# Patient Record
Sex: Male | Born: 1935 | Race: Black or African American | Hispanic: No | Marital: Married | Smoking: Former smoker
Health system: Southern US, Community
[De-identification: ages and names within clinical notes are randomized; demographics above are authoritative.]

## PROBLEM LIST (undated history)

## (undated) DIAGNOSIS — F329 Major depressive disorder, single episode, unspecified: Secondary | ICD-10-CM

## (undated) DIAGNOSIS — M109 Gout, unspecified: Secondary | ICD-10-CM

## (undated) DIAGNOSIS — I2089 Other forms of angina pectoris: Secondary | ICD-10-CM

## (undated) DIAGNOSIS — I2510 Atherosclerotic heart disease of native coronary artery without angina pectoris: Secondary | ICD-10-CM

## (undated) DIAGNOSIS — G20 Parkinson's disease: Secondary | ICD-10-CM

## (undated) DIAGNOSIS — G20A1 Parkinson's disease without dyskinesia, without mention of fluctuations: Secondary | ICD-10-CM

## (undated) DIAGNOSIS — E0590 Thyrotoxicosis, unspecified without thyrotoxic crisis or storm: Secondary | ICD-10-CM

## (undated) DIAGNOSIS — F32A Depression, unspecified: Secondary | ICD-10-CM

## (undated) DIAGNOSIS — I509 Heart failure, unspecified: Secondary | ICD-10-CM

## (undated) DIAGNOSIS — F0390 Unspecified dementia without behavioral disturbance: Secondary | ICD-10-CM

## (undated) DIAGNOSIS — I208 Other forms of angina pectoris: Secondary | ICD-10-CM

## (undated) HISTORY — PX: CORONARY ARTERY BYPASS GRAFT: SHX141

---

## 2001-02-10 ENCOUNTER — Encounter: Payer: Self-pay | Admitting: Pulmonary Disease

## 2001-02-10 ENCOUNTER — Ambulatory Visit (HOSPITAL_COMMUNITY): Admission: RE | Admit: 2001-02-10 | Discharge: 2001-02-10 | Payer: Self-pay | Admitting: Pulmonary Disease

## 2001-02-26 ENCOUNTER — Inpatient Hospital Stay (HOSPITAL_COMMUNITY): Admission: EM | Admit: 2001-02-26 | Discharge: 2001-03-16 | Payer: Self-pay | Admitting: Emergency Medicine

## 2001-02-27 ENCOUNTER — Encounter: Payer: Self-pay | Admitting: Cardiothoracic Surgery

## 2001-02-27 ENCOUNTER — Encounter: Payer: Self-pay | Admitting: Interventional Cardiology

## 2001-02-28 ENCOUNTER — Encounter: Payer: Self-pay | Admitting: Cardiothoracic Surgery

## 2001-03-01 ENCOUNTER — Encounter: Payer: Self-pay | Admitting: Cardiothoracic Surgery

## 2001-03-02 ENCOUNTER — Encounter: Payer: Self-pay | Admitting: Surgery

## 2001-03-03 ENCOUNTER — Encounter: Payer: Self-pay | Admitting: Surgery

## 2001-03-04 ENCOUNTER — Encounter: Payer: Self-pay | Admitting: Cardiothoracic Surgery

## 2001-03-09 ENCOUNTER — Encounter: Payer: Self-pay | Admitting: Cardiothoracic Surgery

## 2001-08-09 ENCOUNTER — Encounter: Payer: Self-pay | Admitting: *Deleted

## 2001-08-10 ENCOUNTER — Inpatient Hospital Stay (HOSPITAL_COMMUNITY): Admission: EM | Admit: 2001-08-10 | Discharge: 2001-08-16 | Payer: Self-pay | Admitting: *Deleted

## 2001-08-12 ENCOUNTER — Encounter: Payer: Self-pay | Admitting: Interventional Cardiology

## 2001-10-06 ENCOUNTER — Encounter (HOSPITAL_COMMUNITY): Admission: RE | Admit: 2001-10-06 | Discharge: 2002-01-04 | Payer: Self-pay | Admitting: Interventional Cardiology

## 2002-01-03 ENCOUNTER — Inpatient Hospital Stay (HOSPITAL_COMMUNITY): Admission: EM | Admit: 2002-01-03 | Discharge: 2002-01-06 | Payer: Self-pay | Admitting: Emergency Medicine

## 2002-01-03 ENCOUNTER — Encounter: Payer: Self-pay | Admitting: Emergency Medicine

## 2002-01-04 ENCOUNTER — Encounter: Payer: Self-pay | Admitting: Interventional Cardiology

## 2004-12-18 ENCOUNTER — Emergency Department (HOSPITAL_COMMUNITY): Admission: EM | Admit: 2004-12-18 | Discharge: 2004-12-18 | Payer: Self-pay | Admitting: Emergency Medicine

## 2005-11-21 ENCOUNTER — Ambulatory Visit (HOSPITAL_COMMUNITY): Admission: RE | Admit: 2005-11-21 | Discharge: 2005-11-21 | Payer: Self-pay | Admitting: Ophthalmology

## 2005-11-25 ENCOUNTER — Encounter: Admission: RE | Admit: 2005-11-25 | Discharge: 2005-11-25 | Payer: Self-pay | Admitting: Neurology

## 2006-05-13 ENCOUNTER — Encounter: Admission: RE | Admit: 2006-05-13 | Discharge: 2006-05-13 | Payer: Self-pay | Admitting: Neurology

## 2007-03-10 ENCOUNTER — Emergency Department (HOSPITAL_COMMUNITY): Admission: EM | Admit: 2007-03-10 | Discharge: 2007-03-10 | Payer: Self-pay | Admitting: Emergency Medicine

## 2008-06-08 ENCOUNTER — Emergency Department (HOSPITAL_COMMUNITY): Admission: EM | Admit: 2008-06-08 | Discharge: 2008-06-08 | Payer: Self-pay | Admitting: Emergency Medicine

## 2008-12-16 ENCOUNTER — Encounter: Admission: RE | Admit: 2008-12-16 | Discharge: 2008-12-16 | Payer: Self-pay | Admitting: Internal Medicine

## 2009-10-30 ENCOUNTER — Emergency Department (HOSPITAL_COMMUNITY): Admission: EM | Admit: 2009-10-30 | Discharge: 2009-10-30 | Payer: Self-pay | Admitting: Family Medicine

## 2009-12-19 ENCOUNTER — Ambulatory Visit: Payer: Self-pay | Admitting: Diagnostic Radiology

## 2009-12-19 ENCOUNTER — Emergency Department (HOSPITAL_BASED_OUTPATIENT_CLINIC_OR_DEPARTMENT_OTHER): Admission: EM | Admit: 2009-12-19 | Discharge: 2009-12-19 | Payer: Self-pay | Admitting: Emergency Medicine

## 2009-12-20 ENCOUNTER — Emergency Department (HOSPITAL_BASED_OUTPATIENT_CLINIC_OR_DEPARTMENT_OTHER): Admission: EM | Admit: 2009-12-20 | Discharge: 2009-12-20 | Payer: Self-pay | Admitting: Emergency Medicine

## 2010-01-12 ENCOUNTER — Encounter: Admission: RE | Admit: 2010-01-12 | Discharge: 2010-01-12 | Payer: Self-pay | Admitting: Podiatry

## 2010-05-01 ENCOUNTER — Emergency Department (HOSPITAL_COMMUNITY): Admission: EM | Admit: 2010-05-01 | Discharge: 2010-05-01 | Payer: Self-pay | Admitting: Emergency Medicine

## 2010-09-10 LAB — DIFFERENTIAL
Basophils Absolute: 0 10*3/uL (ref 0.0–0.1)
Basophils Absolute: 0 10*3/uL (ref 0.0–0.1)
Basophils Relative: 0 % (ref 0–1)
Basophils Relative: 0 % (ref 0–1)
Eosinophils Absolute: 0.1 K/uL (ref 0.0–0.7)
Eosinophils Relative: 2 % (ref 0–5)
Eosinophils Relative: 2 % (ref 0–5)
Lymphocytes Relative: 18 % (ref 12–46)
Lymphocytes Relative: 21 % (ref 12–46)
Lymphs Abs: 1.3 K/uL (ref 0.7–4.0)
Monocytes Absolute: 0.4 10*3/uL (ref 0.1–1.0)
Monocytes Absolute: 0.5 10*3/uL (ref 0.1–1.0)
Monocytes Relative: 7 % (ref 3–12)
Monocytes Relative: 8 % (ref 3–12)
Neutro Abs: 3.8 10*3/uL (ref 1.7–7.7)
Neutro Abs: 4.3 10*3/uL (ref 1.7–7.7)
Neutrophils Relative %: 69 % (ref 43–77)
Neutrophils Relative %: 74 % (ref 43–77)

## 2010-09-10 LAB — CBC
HCT: 39.5 % (ref 39.0–52.0)
Hemoglobin: 13.5 g/dL (ref 13.0–17.0)
MCH: 29.8 pg (ref 26.0–34.0)
MCHC: 33.2 g/dL (ref 30.0–36.0)
MCHC: 34.2 g/dL (ref 30.0–36.0)
MCV: 87.4 fL (ref 78.0–100.0)
MCV: 88.2 fL (ref 78.0–100.0)
Platelets: 132 10*3/uL — ABNORMAL LOW (ref 150–400)
Platelets: 134 10*3/uL — ABNORMAL LOW (ref 150–400)
RBC: 4.52 MIL/uL (ref 4.22–5.81)
RDW: 13.1 % (ref 11.5–15.5)
WBC: 6.2 K/uL (ref 4.0–10.5)

## 2010-09-10 LAB — BASIC METABOLIC PANEL
BUN: 24 mg/dL — ABNORMAL HIGH (ref 6–23)
CO2: 31 mEq/L (ref 19–32)
Glucose, Bld: 100 mg/dL — ABNORMAL HIGH (ref 70–99)
Potassium: 3.3 mEq/L — ABNORMAL LOW (ref 3.5–5.1)
Sodium: 142 mEq/L (ref 135–145)

## 2010-09-10 LAB — URIC ACID: Uric Acid, Serum: 7.2 mg/dL (ref 4.0–7.8)

## 2010-09-12 LAB — POCT I-STAT, CHEM 8
BUN: 30 mg/dL — ABNORMAL HIGH (ref 6–23)
Calcium, Ion: 1.16 mmol/L (ref 1.12–1.32)
Creatinine, Ser: 1.6 mg/dL — ABNORMAL HIGH (ref 0.4–1.5)
Hemoglobin: 14.6 g/dL (ref 13.0–17.0)
Potassium: 3.8 mEq/L (ref 3.5–5.1)
Sodium: 141 mEq/L (ref 135–145)
TCO2: 35 mmol/L (ref 0–100)

## 2010-11-10 NOTE — H&P (Signed)
[place]. [facility]  Patient:    Reginald Stokes, Reginald Stokes Visit Number: [id number] MRN: [id number]          Service Type: MED Location: [phone] Attending Physician:  Lyn Records. Iii Dictated by:   Anselm Lis, N.P. Admit Date:  08/09/2001 Discharge Date: 08/16/2001   CC:         Eino Farber., M.D.   History and Physical  PRIMARY CARE Tamitha Norell:  Dr. Mina Marble, Montez Hageman.  PRIMARY CARDIOLOGIST:  Dr. Darci Needle.  IMPRESSION: 1. Recurrent atypical angina; postprandial and not responsive to proton pump    inhibitor.  Character of discomfort described as identical quality and    location as prior to coronary artery bypass graft. 2. Systemic anticoagulation secondary to history of post coronary artery    bypass graft pulmonary emboli.  PLAN: 1. Treat as unstable angina pectoris with subcu Lovenox, IV nitroglycerin,    aspirin and Plavix. 2. Diagnostic cardiac catheterization versus Cardiolite per Dr. Tracey Harries    evaluation. 3. Continue PPI.  Consider GI evaluation.  HISTORY OF PRESENT ILLNESS:  Mr. Reginald Stokes is a very pleasant 77 year old gentleman who is now five months status post CABG.  He came to the emergency room last evening approximately 11 oclock complaining of substernal aching and burning chest pain that began after eating.  This has been occurring for weeks and this pain is identical to that which occurred prior to CABG.  Pain slowly resolved with no therapy.  It is associated with shortness of breath. Symptoms were quite atypical on initial presentation, August to September. Patient has eaten breakfast and pain slowly recurring.  Recently given Nexium and isosorbide without significant relief.  He was scheduled for a treadmill test on August 11, 2001, Dr. Katrinka Blazing.  PREVIOUS MEDICAL HISTORY: 1. Coronary atherosclerotic heart disease with CABG x4, February 27, 2001,    LIMA to the LAD, SVG to OM-1, SVG to  OM-3. 2. Hypertension. 3. BPH. 4. Gout. 5. ? GERD. 6. History of PE with CABG. 7. Systemic anticoagulation secondary to PE.  ALLERGIES:  No known drug allergies.  Okay with seafood, shellfish and iodinated products.  MEDICATIONS: 1. Isosorbide mononitrate 60 mg p.o. q.d. 2. Lisinopril/HCTZ 20/12.5 mg two tablets p.o. q.d. 3. Metoprolol 50 mg one-half tab p.o. q.12h. 4. Coumadin 5 mg p.o. q.d. 5. Nexium 40 mg per day. 6. Allopurinol 300 mg one-half p.o. q.d.  SOCIAL HISTORY AND HABITS:  He is married with one child.  Denies tobacco abuse for the past 30 years.  He occasionally drinks alcohol.  FAMILY HISTORY:  Mother died of CVA.  Father died of hypertension and CAD.  REVIEW OF SYSTEMS:  As in HPI, otherwise, benign.  PHYSICAL EXAMINATION:  VITAL SIGNS:  Blood pressure 150/84, heart rate 72 and regular, respiratory rate 20, O2 saturation 96%, 2 L nasal cannula.  GENERAL:  He is a 77 year old gentleman who appears sluggish but is oriented and pleasantly conversant.  HEENT/NECK:  Brisk bilateral carotid upstroke without bruit.  No significant JVD nor thyromegaly.  CHEST:  Decreased breath sounds with some dry crackles.  CARDIAC:  Regular rate and rhythm without murmur, rub nor gallop.  Sternotomy incision well-healed.  ABDOMEN:  Soft, nontender.  No hepatosplenomegaly.  Normoactive bowel sounds. Nontender to applied pressure.  EXTREMITIES:  Distal pulses intact.  Negative pedal edema.  NEUROLOGIC:  Grossly nonfocal.  Alert and oriented x3.  GENITAL:  Deferred.  RECTAL:  Deferred.  SKIN:  Warm, dry and clear.  LABORATORY TESTS AND DATA:  Hemoglobin of 10.6, hematocrit of 31.8, WBC of 6.7, platelets are 251,000.  Sodium 136, potassium of 3.1, chloride of 101, CO2 of 27, BUN of 15, creatinine 1.3, glucose 124.  Pro time of 23.6 with INR of 2.7, PTT of 57.  CK of 90, MB fraction of 0.4, troponin I of 0.02.  Chest x-ray revealed cardiomegaly with vascular  congestion.  Left lung scarring or atelectasis.  EKG, August 09, 2001, reveals NSR at 87 beats per minute with some slight ST depression and flattening, inferior and lateral leads. Dictated by:   Anselm Lis, N.P. Attending Physician:  Lyn Records. Iii DD:  09/18/01 TD:  09/18/01 Job: [id number]

## 2010-11-10 NOTE — Cardiovascular Report (Signed)
[place]. [facility]  Patient:    Reginald Stokes, Reginald Stokes Visit Number: [id number] MRN: [id number]          Service Type: MED Location: [phone] Attending Physician:  Cathren Laine Dictated by:   Darci Needle, M.D. Proc. Date: 02/26/01 Admit Date:  02/26/2001   CC:         Eino Farber., M.D.  Kerry Kass, M.D. [facility]   Cardiac Catheterization  INDICATIONS FOR PROCEDURE:  Recurrent chest pain in this gentleman.  This study is being done to rule out coronary artery disease.  He has been previously scheduled for a stress test.  However, the presentation has led to this evaluation.  PROCEDURES PERFORMED: 1. Left heart catheterization. 2. Selective coronary angiography. 3. Left ventriculography.  DESCRIPTION OF PROCEDURE:  After informed consent, a 6 French sheath was inserted into  the right femoral artery using a modified Seldinger technique. A 6 French A2 multipurpose catheter was used for hemodynamic recordings, left ventriculography, and selective left and right coronary angiography.  The patient tolerated the procedure without complications.  A total of 200 mcg of intracoronary nitroglycerin was given into the left coronary artery due to chest pain during the procedure with resolution of symptoms.  No complications occurred.  RESULTS:  I:  HEMODYNAMIC DATA:     a. The aortic pressure was 156/75 mmHg.     b. Left ventricular pressure 156/14 mmHg.  II:  LEFT VENTRICULOGRAPHY:  The left ventricle is normal in size and demonstrates normal overall contractility.  The ejection fraction is 55%.  No mitral regurgitation.  III:  SELECTIVE CORONARY ANGIOGRAPHY:  There is heavy calcification in the left coronary artery system.  The calcification appears to be predominately in the LAD.     a. Left main:  The left main coronary artery is widely patent.     b. Left anterior descending coronary artery:  The LAD is a large vessel  that is heavily calcified in its proximal segment.  There is a 60%        stenosis beyond the first septal perforator in the LAD.  Beyond the        second diagonal in the distal portion of the mid LAD there is a        segmental 85-90% stenosis.  The LAD is large and wraps around the left        ventricular apex.  The first diagonal is large and is totally        occluded filling late by left to left collaterals.     c. Circumflex artery:  The circumflex artery gives origin to two large        branching obtuse marginal vessels.  The first obtuse marginal contains        50% proximal stenosis and 99% distal stenosis before it bifurcates.        The second obtuse marginal contains 75% stenosis in its mid body.  The        mid circumflex beyond the first obtuse marginal contains 50-60%        narrowing.     d. Right coronary artery:  The right coronary artery arises anteriorly from        the right sinus of Valsalva.  There is segmental 75% proximal/mid        stenosis. The distal right coronary artery contains 99% stenosis beyond        the PDA.  The right  coronary before the PDA contains an eccentric        40-50% stenosis.  CONCLUSIONS: 1. Multivessel coronary disease with high-grade obstruction in the mid    left anterior descending, total obstruction of the first diagonal, 99%    stenosis in the first obtuse marginal, 80% stenosis in the second obtuse    marginal, segmental 70-80% stenosis in the proximal/mid right coronary. 2. Preserved overall left ventricular function.  PLAN: CVTS evaluation for coronary artery bypass grafting. Dictated by:   Darci Needle, M.D. Attending Physician:  Cathren Laine DD:  02/26/01 TD:  02/26/01 Job: [id number]

## 2010-11-10 NOTE — Discharge Summary (Signed)
[place]. [facility]  Patient:    Reginald Stokes, Reginald Stokes Visit Number: [id number] MRN: [id number]          Service Type: MED Location: [phone] Attending Physician:  Lyn Records. Iii Dictated by:   Areta Haber, P.A.C. Admit Date:  02/26/2001 Disc. Date: 03/15/01   CC:         Armanda Magic, M.D.  Darci Needle, M.D.   Discharge Summary  ADDENDUM:  Mr. Clock was originally tentatively scheduled for possible discharge on the morning of March 07, 2001.  He, however, continued to have oxygen desaturations with ambulation.  He was subsequently followed with aggressive pulmonary toilet but continued to do same.  He was felt to require further evaluation for this and on March 09, 2001, he had a spiral CT scan which showed pulmonary emboli in bilateral lower lobes.  Due to this, the patient was felt to require continued admission for anticoagulation therapy with heparin and subsequently Coumadin.  The patient also had a venous Duplex examination which was negative for deep venous thrombosis.  This was performed on March 10, 2001.  Clinically, the patient maintained stability and showed a gradual improvement in his oxygenation to where he is tolerating room air and ambulating without significant desaturations.  He is noted, however, to be occasionally in the high 80s with his ambulation.  He is overall otherwise felt to be doing well.  His incisions are healing without signs of infection.  He is tolerating diet and other activities commensurate for level of postoperative convalescence.  Daily INRs have been obtained and most recent is 1.7 on March 14, 2001.  The patient will have a value checked on March 15, 2001, and assuming continued clinical stability, he will be discharged at that time.  Home care has also been arranged for home nursing to check on the patient.  Additionally, they can monitor his INR.  Results will be called to  the [facility] cardiology office.  MEDICATIONS ON DISCHARGE: 1. Coumadin dose to be determined at time of discharge. 2. Allopurinol 300 mg daily. 3. Lopressor 25 mg b.i.d. 4. Aspirin 325 mg daily. 5. Combivent inhaler two puffs every six hours. 6. Ferrous gluconate 30 mg daily. 7. Digoxin 0.125 mg daily. 9. For pain, Tylox one or two every six hours p.r.n. as needed.  DISCHARGE INSTRUCTIONS:  The patient will receive written instructions in regard to medications, activity, diet, wound care and follow-up.  FINAL DIAGNOSIS:  Coronary artery disease, severe three vessel with unstable angina on admission.  OTHER DIAGNOSES: 1. Hypertension. 2. Hyperlipidemia. 3. Previous left forearm surgery. 4. History of gout. 5. Postoperative bilateral pulmonary embolism. Dictated by:   Areta Haber, P.A.C. Attending Physician:  Lyn Records. Iii DD:  03/14/01 TD:  03/14/01 Job: [id number]

## 2010-11-10 NOTE — Cardiovascular Report (Signed)
[place]. [facility]  Patient:    Reginald Stokes, Reginald Stokes Visit Number: [id number] MRN: [id number]          Service Type: MED Location: [phone] Attending Physician:  Lyn Records. Iii Dictated by:   Darci Needle, M.D. Proc. Date: 08/15/01 Admit Date:  08/09/2001   CC:         Mikey Bussing, M.D.  Eino Farber., M.D.  James L. Randa Evens, M.D.   Cardiac Catheterization  INDICATIONS FOR PROCEDURE:  Recent bypass surgery in September, recurrent angina recently, documentation of bypass graft occlusion on catheterization of August 13, 2001.  The patient is having postprandial angina, with studies being done to attempt to dilate the native obtuse marginal which supplies collaterals also to the first diagonal.  The obtuse marginal graft has closed. The obtuse marginal supplied collaterals to the diagonal.  It is hoped that dilatation of the native obtuse marginal will improve angina symptoms.  PROCEDURES PERFORMED: 1. Left heart catheterization. 2. Percutaneous coronary intervention on the native obtuse marginal #1 with    angioplasty followed by cutting balloon angioplasty.  DESCRIPTION OF PROCEDURE:  After informed consent, the left groin was prepped and draped in the usual fashion.  Two percent Xylocaine was used for local anesthesia.  A 7-French sheath was inserted into the femoral artery using the modified Seldinger technique.  A 7-French 3.5 Voda guide catheter was used for guide shots.  A BMW wire was used and with mild to moderate difficulty, we were able to cross the total occlusion in the native obtuse marginal #1.  We were then able to use a 3.5 mm balloon proximally and a 2.5 x 20 mm long balloon distally in the obtuse marginal to recanalize this vessel.  Because of recoil at the ostium of the obtuse marginal, we used a 3.5 cutting balloon.  The final angiographic result was quite acceptable with TIMI grade III flow  reestablished from a total occlusion state.  ACT post procedure using Angiomax was 325 seconds.  CONCLUSIONS:  Successful recanalization of the totally occluded first obtuse marginal using balloon angioplasty and cutting balloon angioplasty.  The patient tolerated the procedure without complications.  PLAN:  Discontinue Angiomax one hour post procedure.  Sheath removal.  Resume Coumadin.  Hopeful for discharge on August 15, 2001.  He should be discharged on Coumadin 5 mg q.d., aspirin 81 mg q.d., Prinzide, Metoprolol, and Nexium.  He should have a followup prothrombin time early next week. Dictated by:   Darci Needle, M.D. Attending Physician:  Lyn Records. Iii DD:  08/15/01 TD:  08/16/01 Job: [id number]

## 2010-11-10 NOTE — H&P (Signed)
NAME:  Reginald Stokes, DOCKTER                          ACCOUNT NO.:  [id number]   MEDICAL RECORD NO.:  [id number]                    PATIENT TYPE:   LOCATION:                                       FACILITY:  [facility]   PHYSICIAN:  [id number]                                 DATE OF BIRTH:  05-Mar-1934   DATE OF ADMISSION:  01/03/2002  DATE OF DISCHARGE:  01/06/2002                                HISTORY & PHYSICAL   PRIMARY CARE [place]:  Dr. Mina Marble.   REASON FOR ADMISSION:  Shortness of breath.   HISTORY OF PRESENT ILLNESS:  The patient is a 77 year old African American  male who developed shortness of breath and orthopnea as well as PND over the  week prior to admission.  He decided on day of admission to present for  evaluation.  He was having some chest tightness and wheezing.  No history of  angina.  He had been admitted, February of 2003, for atypical chest pain  with subsequent cardiac catheterization revealing severe three-vessel CAD.  He is status post CABG, 2002.  At time of cardiac catheterization, February  2003, SVG to OM #1 was 100% stenosed.  SVG to OM #2 was patent.  SVG to RCA  was patent.  LIMA to the LAD was patent.  The patient had undergone a  negative upper GI workup including endoscopy.  A stress Cardiolite had  revealed lateral wall ischemia and he subsequently underwent percutaneous  intervention to OM #1 which was successful.  The patient has not had  congestive heart failure before.   PREVIOUS MEDICAL HISTORY:  1. History of coronary arteriosclerotic heart disease as outlined above.     CABG, February 26, 2001, complicated by pulmonary emboli.  2. Gout.  3. Dyslipidemia.   PREVIOUS SURGICAL HISTORY:  CABG, February 26, 2001.  Repair of broken arm.   ALLERGIES:  No known drug allergies.   MEDICATIONS:  1. Coumadin 5 mg p.o. every day.  2. Allopurinol 350 mg p.o. every day.  3. Lopressor 25 mg p.o. every day.  4. Baby aspirin 81 mg p.o. every day.  5.  Ismo 60 mg p.o. every day.  6. Claritin 10 mg p.o. every day.  7. I am not sure if he was on lisinopril/hydrochlorothiazide 20/12.5 mg or     Avalide 300/12.5 mg p.o. q.d.  8. Benicar was listed as an admission medication -- 40 mg p.o. q.d.  9. Centrum Silver one p.o. q.d.  10.      Nexium 40 mg p.o. q.d.   SOCIAL HISTORY AND HABITS:  The patient is married with one child.  He  denies tobacco abuse for the past 30 years.  He denies alcohol use.   FAMILY HISTORY:  Mother died of CVA.  Father died of hypertension and CAD.  REVIEW OF SYSTEMS:  Ambulates with the use of cane and crutches.  Currently  complaining of dyspnea with exertion, at rest and with reclining position.  Denies problems with lightheadedness, dizziness, syncope nor near-syncopal  spells.  Negative history of diabetes.  Denies problems with constipation,  diarrhea, melena nor bright red blood PR; last bowel movement day prior to  admission.  Mentioned problems with urinary frequency.  Denies generalized  muscular aches or pains.  Wears corrective lenses.  Denies problems with  insomnia nor current pain.   PHYSICAL EXAMINATION:  VITAL SIGNS:  Blood pressure 177/98, heart rate 70  and regular, respiratory rate 26. O2 saturation 96% on room air.  GENERAL:  In general, he is a 77 year old black male appearing well and  comfortable.  HEENT/NECK:  Brisk bilateral carotid upstrokes without bruits.  No  significant JVD.  CHEST:  Bilateral rales one-half up.  CARDIAC:  Regular rhythm without murmur, rub nor gallop.  Normal S1 and S2.  ABDOMEN:  Soft, nondistended, normoactive bowel sounds.  Negative abdominal  aortic, renal nor femoral bruit. Nontender to applied pressure.  No masses  or hepatosplenomegaly.  EXTREMITIES:  Good distal pulses.  Negative edema.  NEUROLOGIC:  Grossly nonfocal.   LABORATORY TESTS AND DATA:  Hemoglobin 11.8, hematocrit of 36.7, WBC of 3.8,  platelets of 207,000.  Sodium 141, potassium of 4.0,  chloride of 104, CO2  29, BUN of 13, creatinine 1.2, glucose 113.  BNP elevated at 963.  LFTs  within normal range.  CK-MB of 1.4.  Troponin I of 0.02.   Chest x-ray revealed pulmonary edema.   EKG revealed sinus rhythm with nonspecific T wave abnormalities.   IMPRESSION:  1. New-onset congestive heart failure without evidence of ischemia on     electrocardiogram nor by cardiac enzymes.  2. History of coronary artery disease.  Prior coronary artery bypass graft,     September 2002, with subsequent percutaneous coronary intervention of     saphenous vein graft to first obtuse marginal.  Other bypass grafts were     patent.  Ejection fraction was 40% at time of percutaneous coronary     intervention, February of this year.  3. History of gout.  4. History of dyslipidemia.  5. Systemic anticoagulation secondary to history of pulmonary embolus at     time of bypass surgery.   PLAN:  1. IV diuresis with Lasix 80 mg q.12h. x2 more doses, then 80 mg p.o. q.d.  2. Watch potassium and creatinine.  Adjust diuretics or supplement potassium     p.r.n.  3.     Will need increased beta blockers once stabilized and CHF compensated.  4. Obtain 2-D echocardiogram.  5. Repeat serial cardiac enzymes.     Salomon Fick, NP                         [id number]    MES/MEDQ  D:  03/12/2002  T:  03/14/2002  Job:  [id number]   cc:   Eino Farber., M.D.  [address] [employer]  [place] [address]  Fax: [phone]

## 2010-11-10 NOTE — Procedures (Signed)
[place]. [facility]  Patient:    Reginald Stokes, Reginald Stokes Visit Number: [id number] MRN: [id number]          Service Type: MED Location: [phone] Attending Physician:  Lyn Records. Iii Dictated by:   Llana Aliment. Randa Evens, M.D. Proc. Date: 08/14/01 Admit Date:  08/09/2001   CC:         Darci Needle, M.D.   Procedure Report  PROCEDURE PERFORMED:  Esophagogastroduodenscopy.  ENDOSCOPIST:  Llana Aliment. Randa Evens, M.D.  MEDICATIONS:  Cetacaine spray, fentanyl 50 mcg, Versed 6 mg IV.  INDICATIONS:  The patient is in the hospital with chest pain.  He has had some dyspepsia but is doing okay on a proton pump inhibitor.  There is some nausea involved.  It is not entirely clear if all of his chest pain is due to his heart or not.  He had a cardiac catheterization showing some vascular disease and likely needs some procedures done.  It may well be that he will be treated medically in the recent future with anticoagulation.  It was felt that endoscopy was needed prior to his discharge and his being restarted back on oral Coumadin.  DESCRIPTION OF PROCEDURE:  The procedure had been explained to the patient and consent obtained.  With the patient in the left lateral decubitus position, the Olympus videoscope was inserted and advanced under direct visualization. The stomach was entered, the pylorus identified and passed.  The duodenum including the bulb and second portion were seen well.  The scope was withdrawn.  The duodenal bulb was free of ulcerations.  The pyloric channel, antrum and body were all normal.  Fundus and cardia were seen well on the retroflex view and were normal.  There were no ulcerations in the body of the stomach.  The scope was withdrawn.  There was no significant hiatal hernia. The distal esophagus was not reddened.  There were no signs of ulceration and the proximal esophagus was normal.  The scope was withdrawn.  The patient tolerated the  procedure quite well, was maintained on low-flow oxygen and pulse oximeter throughout the procedure with no obvious abnormalities.  ASSESSMENT:  The patient probably does have gastroesophageal reflux disease to some extent but it is very minor and I do not think this is contributing to his pain.  PLAN:  Will go ahead and resume Lovenox and he will proceed ahead with anticoagulation and cardiac studies as planned. Dictated by:   Llana Aliment. Randa Evens, M.D. Attending Physician:  Lyn Records. Iii DD:  08/14/01 TD:  08/15/01 Job: [id number]

## 2010-11-10 NOTE — Op Note (Signed)
[place]. [facility]  Patient:    Reginald Stokes, Reginald Stokes Visit Number: [id number] MRN: [id number]          Service Type: Attending:  Mikey Bussing, M.D. Dictated by:   Mikey Bussing, M.D. Proc. Date: 02/28/01   CC:         CVTS office  Dr. Verdis Prime   Operative Report  PREOPERATIVE DIAGNOSIS:  Class 4 unstable angina with severe three-vessel coronary artery disease.  POSTOPERATIVE DIAGNOSIS:  Class 4 unstable angina with severe three-vessel coronary artery disease.  OPERATION PERFORMED:  Coronary artery bypass grafting x 4 (left internal mammary artery to the left anterior descending, saphenous vein graft to the posterior descending coronary artery, saphenous vein graft to obtuse marginal 1, saphenous vein graft obtuse marginal 2).  SURGEON:  Mikey Bussing, M.D.  ASSISTANT:  Areta Haber, P.A.  ANESTHESIA:  General.  ANESTHESIOLOGIST:  Dr. Claybon Jabs.  INDICATIONS FOR PROCEDURE:  The patient is a 77 year old black male with hypertension and new onset angina.  He was admitted and ruled out for myocardial infarction.  Cardiac catheterization by Dr. Garnette Scheuermann demonstrated severe three-vessel coronary artery disease with preserved systolic ventricular function and he was referred for surgical coronary revascularization.  Prior to surgery I reviewed the results of the cardiac catheterization with the patient and family.  I discussed the indications and expected benefits of coronary artery bypass graft surgery.  I reviewed the alternatives to surgical therapy for his coronary artery disease.  We discussed the major aspects of the operation including the location of the surgical incisions, the choice of conduit, the use of general anesthesia and cardiopulmonary bypass, and the expected postoperative recovery.  I reviewed with the patient and his wife the risks associated with this operation including the risks of MI, CVA, bleeding,  infection and death.  He understood these aspects of the operation and agreed to proceed with the operation as planned under informed consent.  OPERATIVE FINDINGS:  The patients coronaries were severely diseased, thickened, calcified and sclerotic.  It was very difficult to construct the distal anastomoses.  The OM1 vessel especially was a poor target for grafting and would not be a suitable vessel for redo grafting.  The distal right coronary artery was a very small vessel and the graft was placed more proximally in the midportion of the right coronary.  The mammary artery was somewhat thickened but had excellent flow.  The diagonal branch of the LAD was too small to graft.  DESCRIPTION OF PROCEDURE:  The patient was brought to the operating room and placed supine on the operating table.  General anesthesia was induced under invasive hemodynamic monitoring,  The chest abdomen and legs were prepped with Betadine and draped as a sterile field.  A median sternotomy was performed as the saphenous vein was harvested from the lower extremity.  The internal mammary artery was harvested as a pedicle graft from its origin at the subclavian vessels.  Heparin was administered and ACT was documented as being therapeutic.  Through pursestring sutures placed in the ascending aorta and right atrium, the patient was cannulated and placed on bypass and cooled to 32 degrees.  The coronaries were identified for grafting and the mammary artery and vein grafts were prepared for the distal anastomoses.  A cardioplegia cannula was placed and the patient was cooled to 28 degrees.  As the aortic crossclamp was applied, 700 cc of cold blood cardioplegia was delivered to the aortic root  with immediate cardioplegic arrest and septal temperature dropping to less than 12 degrees.  Topical iced saline slush was used to augment myocardial preservation and a pericardial insulator pad was used to protect the left  phrenic nerve.  The distal coronary anastomoses were then performed.  The first distal anastomosis was to the midportion of the right coronary artery after the acute marginal branch.  This was a 1.5 mm vessel with proximal 80% stenosis and a reversed saphenous vein was sewn end-to-side with running 7-0 Prolene with a good flow through the graft.  The second distal anastomosis was to the OM1 which was a diffusely, heavily diseased sclerotic vessel.  A reversed saphenous vein was sewn end-to-side with running 7-0 Prolene and there was suboptimal flow through this graft but the graft was patent.  The third distal anastomosis was to the OM2, which was a softer, better target for grafting. Reversed saphenous vein was sewn end-to-side with running 7-0 Prolene.  There was good flow through the graft.  Cardioplegia was redosed.  The fourth distal anastomosis was to the distal aspect of the LAD, which was heavily diseased, thickened and sclerotic.  The internal mammary artery was brought through an opening created in the left pericardium and was brought down on the LAD and sewn end-to-side with a running 8-0 Prolene.  There was excellent flow through the anastomosis with immediate rise in septal temperature after release of the pedicle clamp on the mammary artery.  The mammary pedicle was secured to the epicardium and the aortic crossclamp was removed.  The heart was cardioverted back to a regular rhythm.  Three proximal vein anastomoses were placed on the ascending aorta using a partial occlusion clamp.  The partial clamp was removed and the grafts were perfused.  The OM2 and right graft had excellent flow.  The OM1 graft had adequate good flow.  Hemostasis was documented at the proximal and distal sites and the patient was rewarmed to 37 degrees. Temporary pacing wires were applied.  The lungs were re-expanded and the ventilator was resumed when the patient was warmed to 37 degrees.  He  was  weaned from bypass with minimal dose of dopamine with stable blood pressure and cardiac output.  Protamine was administered and the cannulas were removed. The mediastinum was irrigated with warm antibiotic irrigation.  The pericardium was loosely reapproximated over the aorta and vein grafts.  The leg incision was irrigated and closed in a standard fashion.  Two mediastinal and a left pleural chest tube were placed and brought out through separate incisions.  The sternum was reapproximated with interrupted steel wire.  The pectoralis fascia was closed with interrupted Vicryl.  The subcutaneous and skin were closed with running Vicryl and sterile dressings were applied. Total bypass time was 120 minutes with aortic crossclamp time of 64 minutes. Dictated by:   Mikey Bussing, M.D. Attending:  Mikey Bussing, M.D. DD:  02/27/01 TD:  02/28/01 Job: [id number]

## 2010-11-10 NOTE — Discharge Summary (Signed)
[place]. [facility]  Patient:    Reginald Stokes, Reginald Stokes Visit Number: [id number] MRN: [id number]          Service Type: MED Location: [phone] Attending Physician:  Lyn Records. Iii Dictated by:   Anselm Lis, N.P. Admit Date:  08/09/2001 Discharge Date: 08/16/2001   CC:         Mikey Bussing, M.D.  Eino Farber., M.D.  James L. Randa Evens, M.D.   Discharge Summary  PRIMARY CARE PHYSICIAN:  Dr. Petra Kuba.  DATE OF BIRTH:  2033-07-09.  CONSULTANTS:  James L. Randa Evens, M.D.  PROCEDURES: 1. 08/13/01: Cardiac catheterization/coronary angiography and bypass    angiography as well as left ventriculogram revealing occlusion of    saphenous vein graft to obtuse marginal #1 with native obtuse marginal    #1 99% occluded.  Native circumflex 99% occluded before obtuse    marginal #1.  Patent saphenous vein graft to obtuse marginal #2 with    native obtuse marginal #2 100% occluded with distal disease.    Supraventricular tachycardia to right coronary artery was patent with    native right coronary artery 100% occluded proximally.  Left internal    mammary artery to the left anterior descending was patent with native    left anterior descending 100% occluded.  Diagonal #1 was 100% occluded,    (left to left collateral flow).  Ejection fraction 40% with mid anterior    wall hypokinesis.  Negative MR. 2. Stress Cardiolite:  Anterior lateral and lateral wall ischemia. Focal    anterior lateral scar.   (08/12/01).  Ejection fraction 43%. 3. 08/14/01, Endoscopy by Dr. Randa Evens revealed no signs of reflux    disease. 4. 08/15/01, Successful percutaneous transluminal coronary angioplasty,    cutting balloon angioplasty, obtuse marginal #1.  DISCHARGE DIAGNOSES: 1. Coronary atherosclerotic heart disease: Patient presented with atypical    postprandial chest discomfort consistent with symptoms of    gastroesophageal reflux disease.    COMMENT:   Patient had not been taking his aspirin as instructed post    bypass surgery.    a. Ruled out myocardial infarction by negative serial cardiac enzymes.    Troponin-I was slightly elevated at 0.07.    b. Stress Cardiolite:  Stress portion of test with electrocardiogram    results positive for evidence of inferior lateral ischemia.  On SPECT    images noted scar formation focally in the anterior lateral wall with    large area of ischemia throughout the lateral wall, ejection fraction    of 43%.    c. Cardiac catheterization by Dr. Verdis Prime revealing native left    main okay.  Native left anterior descending 100% with patent left    internal mammary artery to the left anterior descending. Diagonal #1    is a 100% occluded and is unprotected.  There are some faint left    to left collateral flow to diagonal #1 and obtuse marginal #1, native    circumflex 99% before obtuse marginal #1 with obtuse marginal #1 99%    occluded.  Saphenous vein graft to obtuse marginal #1 is occluded.    Native obtuse marginal #2 100% occluded with patent saphenous vein graft    to obtuse marginal #2.  There is, however, distal obtuse marginal #2    disease.  Native right coronary artery 100% proximal but patent    saphenous vein graft to right coronary artery.  Dr. Katrinka Blazing noted a  possible dissection or valve mid right coronary artery graft.    d. Subsequent endoscopy by Dr. Randa Evens was negative for any signs    of reflux disease. Dr. Katrinka Blazing felt there was no explanation for symptom    complex other than angina of which he continued to have at rest.    Thus on 08/15/01:    e. Successful percutaneous transluminal coronary angioplasty, cutting    balloon angioplasty of obtuse marginal #1.   Patient continued Angiomax    one hour post laboratory and then this was discontinued.  The patient    ambulated on cardiac rehabilitation with good endurance.  No recurrent    chest pain.    f. Epigastric/substernal  burning for two weeks prior to admission which    was postprandial, resolving with sitting up and exacerbation when    recumbency.  Initial thoughts this was related to gastroesophageal reflux    disease were dispelled by negative endoscopy done by Dr. Randa Evens. Post    percutaneous coronary intervention by Dr. Verdis Prime resulted in total    resolution of these symptoms which in fact must be his anginal    equivalent. 2. Systemic anticoagulation secondary to pulmonary emboli.  This is    the patients fifth month on Coumadin.  His protime and INR levels    were in good range at time of admission.  Patients Coumadin was placed    on hold with substitution of subcutaneous Lovenox during the course    of his admission.  The patient was given dose of vitamin-K 3 mg    intravenously on the 18th in anticipation of percutaneous coronary    intervention on the 21st.  His protime was decreased to 1.4 on the    20th and 1.6 on the 21st.  He was restarted on Lovenox subcutaneously    post intervention.  He was restarted on Coumadin as before at the    time of discharge.  Follow up protime and INR levels Monday or Tuesday    post discharge. 3. Hypokalemia; Serum potassium of 3.1.  This was supplemented.  The patients    potassium was 4.1 at the time of discharge.  PLAN: 1. Patient is discharged home in stable condition. 2. Discharge medications:    a. Nexium 40 mg one q.d.    b. Lisinopril- Hydrochlorothiazide 20/12.5 p.o. q.d.    c. Metoprolol 50 mg one-half tab p.o. b.i.d.    d. Coumadin 5 mg one p.o. q.d. as before.    e. Allopurinol 300 mg one-half tablet p.o. q.d.    f. Imdur (isosorbide mononitrate) 60 mg per day.    g. Restart enteric coated baby aspirin 81 mg per day. 3. Activity: No heavy lifting, pushing, no driving today and tomorrow    and then as before. 4. Diet:  Low fat, low cholesterol, no added salt. 5. Wound care: May shower.  6. Special instructions:  Patient is to call our  clinic if he develops    a large amount of swelling or bruising in the groin area. 7. Protime and INR to be drawn Monday or Tuesday following discharge. (Our    Clinic will call with that appointment.) 8. Follow up:  September 01, 2001 at 4:30 p.m. Dr. Verdis Prime.  HISTORY OF PRESENT ILLNESS:  The patient is a very pleasant 77 year old gentleman five months status post coronary artery bypass graft who came into the emergency department with complaint of substernal aching and burning chest discomfort which began postprandial.  This has been occurring for approximately weeks and is identical to that occurring prior to his coronary artery bypass grafting.  His pain has slowly resolved without therapy.  It was associated with shortness of breath.  Symptoms were quite atypical on his initial presentation in August to September.  The patient had eaten breakfast and pain is slowly recurring.  He was recently given Nexium and isosorbide without significant relief.  PAST MEDICAL HISTORY: 1. Hypertension. 2. Benign prostatic hypertrophy. 3. Dyslipidemia. 4. Gout. 5. Question of gastroesophageal reflux disease. 6. History of coronary artery bypass grafting times four 02/27/01 with    left internal mammary artery to left anterior descending, saphenous vein    graft to obtuse marginal #1, saphenous vein graft to obtuse marginal    #3, saphenous vein graft to right coronary artery. 7. History of pulmonary emboli with bypass graft surgery. 8. Systemic anticoagulation secondary to his history of pulmonary embolism.  Further hospital events are as detailed above.  LABORATORY DATA: CBC:  White blood cell count 6.7, hemoglobin 10.6, hematocrit 31.8, platelet count 251K.  PROTIME:  On admission was 22.6, INR 2.7, PTT 57.  On discharge the protime is 17.1, INR 1.6.  ELECTROLYTES:  Sodium 136, potassium 3.1 which was supplemented.  Subsequently his potassium was 3.6.  It was decreased again on 09/11/01  down to 3.4 and was supplemented and was 4.1 at time of discharge.  Chloride 101, cO2 27, glucose 124; was 95 at time of discharge.  BUN 15, creatinine 1.3, calcium 8.3.  LIVER FUNCTION TESTS:  The liver function tests are within normal range.  CARDIAC ENZYMES:  First CK 90, MB fraction 0.4, troponin-I 0.02, second troponin-I 0.04, third set of cardiac enzymes revealed CK 76, MB fraction 0.9, troponin-I 0.07.  Fourth set of cardiac enzymes the CK was 69, MB fraction 2.2.  STATIN PROFILE:  Cholesterol 152, triglycerides 74, HDL 44, LDL 93.  CHEST X-RAY:  08/14/01 revealed moderate cardiomegaly.  Thoracolumbar scoliosis with osteopenia.  No definitive active infiltrate or congestive heart failure. On 08/09/01 the chest x-ray revealed cardiomegaly with vascular congestion. Left scarring or atelectasis.  Total time preparing dictation was greater than 45 minutes including dictating the discharge summary, filling out and reviewing discharge instructions with patient. Dictated by:   Anselm Lis, N.P. Attending Physician:  Lyn Records. Iii DD:  09/17/01 TD:  09/18/01 Job: [id number]

## 2010-11-10 NOTE — Cardiovascular Report (Signed)
[place]. [facility]  Patient:    JETTSON, CRABLE Visit Number: [id number] MRN: [id number]          Service Type: MED Location: [phone] Attending Physician:  Lyn Records. Iii Dictated by:   Darci Needle, M.D. Proc. Date: 08/13/01 Admit Date:  08/09/2001   CC:         Eino Farber., M.D.  Mikey Bussing, M.D.  James L. Randa Evens, M.D.   Cardiac Catheterization  INDICATION FOR PROCEDURE:  Recurring post prandial substernal burning, abnormal Cardiolite demonstrating anterolateral and lateral wall ischemia.  PROCEDURE PERFORMED: 1. Left heart catheterization. 2. Selective coronary angiography. 3. Left ventriculography. 4. Bypass graft angiography. 5. Left internal mammary artery graft angiography.  DESCRIPTION OF PROCEDURE:  After informed consent, a #6 French sheath was placed in the right femoral artery using the modified Seldinger technique.  A #6 French A2 multipurpose catheter was used for hemodynamic recordings, left ventriculography, and selective left and coronary angiography.  The internal mammary catheter was also used for saphenous vein graft angiography.  A mammary catheter was used for left internal mammary artery angiography.  The patient tolerated the procedure without complications.  Noted during the procedure and of concern was significant O2 desaturation with room air O2 saturation in the 89-91% range.  This improved with oxygen to 93%.  RESULTS:   I. Hemodynamic data      A. Aortic pressure 141/77.      B. Left ventricular pressure 140/27.  II. Left ventriculography:  The mid anterior wall is hypokinetic.  EF is      estimated to be 40%.  No MR is noted. III. Selective coronary angiography      A. Left main coronary:  Widely patent.      B. Left anterior descending coronary:  Heavily calcified in the proximal         vessel.  Totally occluded in the mid vessel after the second diagonal.         A  large first diagonal was totally occluded.      C. Circumflex artery:  Essentially totally occluded before the first         obtuse marginal.  There is very faint antegrade flow into the first         and second obtuse marginal branches that are occluded in the mid         vessels.      D. Right coronary:  Totally occluded proximal.  IV. Bypass graft angiography:      A. Saphenous vein graft to OM-1:  Totally occluded.      B. Saphenous vein graft to OM-2:  Widely patent.  There is disease distal         to the graft insertion site in the second obtuse marginal.      C. Saphenous vein graft to the right coronary:  This graft plugs into the         distal right coronary.  There is a region in the mid graft that either         represents a valve or a local dissection.   V. Left internal mammary artery to the LAD:  The mammary could never be      selectively engaged.  It appears to arise as part of another subclavian      artery branch.  Flush-out demonstrates patency all the way down to the  LAD.  Irregularities are noted at the distal graft insertion site,      possibly related to clips that are noted to be present.  CONCLUSIONS: 1. Thrombotic occlusion of the saphenous vein graft to obtuse marginal #1. 2. Abnormality noted in the mid portion of the saphenous vein graft to the    right possibly representing a valve.  Rule out dissection. 3. Patent left internal mammary artery to the left anterior descending artery,    saphenous vein graft to the right, and saphenous vein graft to obtuse    marginal #2. 4. Severe native coronary artery disease with total occlusion of the mid left    anterior descending artery, total occlusion of the first diagonal, total    occlusion of the first and second obtuse marginals, and total occlusion of    the right coronary artery.  There are collaterals to the first diagonal and    to the first obtuse marginals noted during the saphenous vein and native     coronary injections. 5. Left ventricular dysfunction.  PLAN:  Review cines.  Consider intervention on the obtuse marginal #1 versus optimization of medical therapy.  ADDENDUM:  In speaking with the patient, he has not been taking aspirin.  This likely accounts for some of the difficulty with vein graft patency. Dictated by:   Darci Needle, M.D. Attending Physician:  Lyn Records. Iii DD:  08/13/01 TD:  08/13/01 Job: [id number]

## 2010-11-10 NOTE — Discharge Summary (Signed)
NAMEARJEN, Reginald Stokes                            ACCOUNT NO.:  [id number]   MEDICAL RECORD NO.:  [id number]                  PATIENT TYPE:   LOCATION:                                       FACILITY:   PHYSICIAN:  Reginald Fick, NP                   DATE OF BIRTH:   DATE OF ADMISSION:  01/03/2002  DATE OF DISCHARGE:  01/06/2002                                 DISCHARGE SUMMARY   PRIMARY CARE PHYSICIAN:  Reginald Stokes, M.D.   PROCEDURES:  2-D echocardiogram on January 05, 2002, revealing ejection  fraction 50-55% with moderate hypokinesis of the entire inferoposterior  wall, mild aortic valve regurgitation with mildly calcified aortic valve,  mild mitral annular calcification, and mild left atrial enlargement at 43  mm.   DISCHARGE DIAGNOSES:  1. Congestive heart failure:  Improved/compensated with IV diuretics.  The     initial BNP was elevated at 963.  2. History of coronary atherosclerotic heart disease:  Status post CABG in     September of 2002; subsequently cardiac catheterization with PCI     revealing severe three-vessel native coronary artery disease, 100%     occlusion of SVG to OM1, but other bypass grafts patent.  Subsequent     successful PCI of OM1.  This admission, he ruled out for MI by negative     serial cardiac enzymes.  A 2-D echocardiogram revealed good EF 50-55%     with moderate hypokinesis of the entire inferoposterior wall.  The     patient's EKG was nonischemic.  His medications were adjusted, including     continuation of ACE inhibitor, initiation of Coreg, continuation of     Imdur, and substitution of Lasix for hydrochlorothiazide.  He was     discharged on a low-salt diet.  His Lopressor was discontinued in favor     of the Coreg.  Further management and monitoring as an outpatient     anticipated.  3. Systemic anticoagulation secondary to a history of pulmonary emboli,     status post coronary artery bypass graft in September of 2002.  Continuing on Coumadin therapy.  His INR at the time of discharge was     1.8.  4. History of dyslipidemia:  Will continue monitoring lipids as an     outpatient.  5. History of gout; on allopurinol.  6. History of gastrointestinal reflux disease:  Continue Nexium as before.   DISPOSITION:  The patient was discharged to home in improved condition.   DISCHARGE MEDICATIONS:  1. (New) lisinopril 20 mg p.o. q.d.  2. (New) Coreg 6.25 mg one p.o. b.i.d.  3. Imdur 60 mg p.o. q.d.  4. (New) Lasix 80 mg p.o. q.d.  5. Coumadin 5 mg alternating with 7.5 mg as before.  6. Allopurinol 150 mg p.o. q.d.  7. Nexium 40 mg p.o. q.d.  8. K-Dur 20 mEq p.o.  q.d.  9. Aspirin 81 mg p.o. q.d.   ACTIVITY:  As tolerated.   DIET:  Low salt.   SPECIAL INSTRUCTIONS:  Stop Lopressor (metoprolol) and lisinopril/HCTZ.   FOLLOW UP:  Laboratory work on January 08, 2002, for BMET indication 428.0.  Office visit with Reginald Stokes, M.D., on January 21, 2002, at 10:45 a.m.   HISTORY OF PRESENT ILLNESS:  The patient is a very pleasant 77 year old  gentleman with a history of prior CABG on February 26, 2002, complicated by  pulmonary emboli for which he has been on subsequent anticoagulation.  He  was admitted earlier this year, February of 2003, for atypical chest pain  with subsequent cardiac catheterization revealing severe three-vessel CAD  with patent bypass grafts, though occluded SVG to OM1.  He underwent a  successful PCI of that OM1.  His EF was 40% at that time.  He had not had a  history of CHF, but was admitted on January 03, 2002, with progressive chest  tightness and wheezing, as well as shortness of breath, orthopnea, and PND  over the week prior to admission.  He was admitted to telemetry where he  ruled out for MI by negative serial cardiac enzymes.  EKGs were nonischemic.  His initial BNP was elevated and chest x-ray was consistent with CHF.  He  was successful diuresed with complication of his  congestive heart failure.  He was continued on his anticoagulants with an INR of 1.8 at the time of  discharge.  His medications were adjusted with substitution of furosemide  for the hydrochlorothiazide, continuation of ACE inhibitor, and substitution  of Coreg for his metoprolol.  A 2-D echocardiogram revealed a good ejection  fraction of 50-55% with moderate hypokinesis of the entire inferoposterior  wall.  His hypokalemia from diuresis was supplemented.  He was discharged  home in improved condition.   LABORATORY DATA:  WBC 3.8, platelets 207, hemoglobin 11.8, hematocrit 36.7,  differential within normal range.  Admission pro time 19.3, INR 1.8.  At the  time of discharge, the pro time was 19.2 and INR 1.8.  Sodium 141, K 4.0, as  well as 3.1 secondary to diuresis, and 3.3 at the time of discharge,  chloride 104, CO2 29, glucose 113, BUN 13, creatinine 1.2; BUN 22 and  creatinine 1.6 at the time of discharge.  LFTs within normal range.  Admission CK 170, MB fraction 1.4, and troponin I 0.02.  BNP 963.  Second CK  219, MB fraction 1.9, and troponin I 0.02.  BNP 811.  The admission chest x-  ray revealed acute CHF.  A follow-up chest x-ray on January 04, 2002, revealed  interval improvement and edema with persistent, but improved patchy  infiltrates or atelectasis in both lung bases and small effusions.  The  admission EKG revealed sinus rhythm with nonspecific T-wave abnormalities.   PAST MEDICAL HISTORY:  1. Coronary atherosclerotic heart disease.     a. CABG x 4 in September of 2002.     b. August 13, 2001, revealing occlusion of SVG to OM1 with native OM1        99% occluded, native circumflex 99% occluded before OM1, patent SVG to        OM2 with native OM2 100% occluded with distal disease, saphenous vein        graft to right coronary artery patent with native right coronary        artery 100% occluded proximally, LIMA to the LAD patent with native  LAD 100% occluded,  diagonal #1 100% occluded (left-to-left collateral        flow), EF 40% with mild anterior wall hypokinesis, and negative MR.        Subsequent successful PTCA/cutting balloon angioplasty of OM1.  A        stress at that time revealed an EF of 43% with anterolateral and        lateral wall ischemia and focal anterolateral scar.  2. Systemic anticoagulation secondary to pulmonary emboli at the time of     bypass graft.  3. History of hypertension.  4. BPH.  5.     Dyslipidemia.  6. History of gout.  7. Question of GERD.                                               Reginald Fick, NP    MES/MEDQ  D:  03/12/2002  T:  03/14/2002  Job:  [id number]   cc:   Eino Farber., M.D.  [address] [place]  [place] [address]  Fax: [phone]

## 2010-11-10 NOTE — Discharge Summary (Signed)
[place]. [facility]  Patient:    Reginald Stokes, Reginald Stokes Visit Number: [id number] MRN: [id number]          Service Type: MED Location: 2000 2007 01 Attending Physician:  Lyn Records. Iii Dictated by:   Areta Haber, Rochel Brome. Admit Date:  02/26/2001 Disc. Date: 03/07/01   CC:         Armanda Magic, M.D.  Darci Needle, M.D.   Discharge Summary  HISTORY OF PRESENT ILLNESS:  This is a 77 year old black male with no previous cardiac history, who was recently evaluated by Dr. Katrinka Blazing for atypical chest pain that was felt to be secondary to uncontrolled hypertension.  His workup was in progress and he was set up for an exercise Cardiolite in the near future; however, on the evening prior to admission he got home from a trip and was unloading the car when he got substernal chest pain with radiation to his back associated with shortness of breath.  Pain was intermittent and the patient stated his wife made him come to the emergency room.  Upon arrival he was pain free but was felt to require admission for further evaluation and treatment.  PAST MEDICAL HISTORY: 1. Hypertension. 2. Benign prostatic hyperplasia. 3. Hyperlipidemia. 4. Hypokalemia.  PAST SURGICAL HISTORY:  He has a history of left arm surgery with a plate placed.  ALLERGIES:  No known allergies.  MEDICATIONS ON ADMISSION: 1. Lisinopril/HCTZ 20/12.5 mg p.o. q.d. 2. Allopurinol 300 mg p.o. q.d. 3. Klor-Con. 4. Lipitor 10 mg q.d. 5. Norvasc 5 mg q.d. 6. Aspirin one q.d.  REVIEW OF SYSTEMS, FAMILY HISTORY, SOCIAL HISTORY, AND PHYSICAL EXAM:  Please see the history and physical done at the time of admission.  HOSPITAL COURSE:  The patient was admitted and was ruled out for myocardial infarction.  Cardiac catheterization was undertaken on February 26, 2001 by Dr. Katrinka Blazing and the following findings were noted:  Left ventriculogram was within normal limits.  The left main coronary artery was  normal.  The LAD had a 90 to 95% mid lesion and the diagonal #1 had 100% lesion. Obtuse marginal one had a 99% lesion and there was also a proximal OM-1 60 to 70% lesion and an OM-2 70% mid lesion.  Right coronary artery had a 75% mid and a 99% distal lesion beyond the PDA.  Impression was severe three vessel coronary artery disease with normal left ventricular function and cardiac surgical consultation was obtained with Kerin Perna III, M.D. who evaluated the patient and his studies and agreed with recommendations to proceed with surgical revascularization procedure. On February 27, 2001 the patient underwent the following procedure:  Coronary artery bypass grafting times four and the following grafts were placed:  (1) Left internal mammary artery to the LAD.  (2) Saphenous vein graft to the obtuse marginal #1.  (3) Saphenous vein graft to the obtuse marginal #2. (4) Saphenous vein graft to the right coronary artery.  The patient tolerated the procedure well and was taken to the surgical intensive care unit in stable condition.  Postoperative hospital course:  The patient has done well.  He has maintained stable hemodynamics.  He has had some slow recovery in regards to his pulmonary function but this shown good and gradual improvement with routine measures as well as nebulizers.  He was also placed on Tequin antibiotic by mouth.  This was due to his low grade temperatures and his pulmonary congestion.  He was able to be weaned  from the oxygen and he is currently maintaining good saturations on room air.  He is tolerating cardiac rehabilitation phase I modalities.  He is responding well also to a gentle diuresis.  Cardiac rhythm has remained normal sinus rhythm.  His incisions are healing well without signs of infection.  He is overall felt to be quite stable for tentative discharge in the morning of March 07, 2001 pending morning round re-evaluation.  FINAL DIAGNOSES: 1.  Coronary artery disease, severe three vessel with unstable angina on    admission. 2. Hypertension. 3. Hyperlipidemia. 4. Previous left forearm surgery. 5. History of gout.  DISCHARGE MEDICATIONS:  1. Allopurinol 300 mg q.d.  2. Lopressor 25 mg b.i.d.  3. Multivitamin one q.d.  4. Aspirin 325 mg q.d.  5. Digoxin 0.125 mg every other day  6. Tylox p.r.n.  7. Lasix 40 mg q.d. x5.  8. Potassium chloride 20 mEq q.d. x5.  9. Tequin 400 mg q.d. for five additional days. 10. Combivent two puffs q.i.d.  FOLLOW-UP:  Will include Dr. Donata Clay in three weeks.  Dr. Katrinka Blazing in two weeks.  Staple removal will be on March 12, 2001 at the [facility] office.  INSTRUCTIONS:  The patient will receive written instructions regarding medications, activity, diet, wound care and follow up.  CONDITION ON DISCHARGE:  Stable and improved.Dictated by:   Areta Haber, Rochel Brome. Attending Physician:  Lyn Records. Iii DD:  03/06/01 TD:  03/06/01 Job: [id number]

## 2010-11-10 NOTE — Consult Note (Signed)
[place]. [facility]  Patient:    Reginald Stokes, Reginald Stokes Visit Number: [id number] MRN: [id number]          Service Type: MED Location: [id number] Attending Physician:  Lyn Records. Iii Dictated by:   Llana Aliment. Randa Evens, M.D. Proc. Date: 08/12/01 Admit Date:  08/09/2001   CC:         Darci Needle, M.D.  Eino Farber., M.D.   Consultation Report  DATE OF BIRTH:  1933/07/26  REASON FOR CONSULTATION:  Atypical chest pain.  HISTORY OF PRESENT ILLNESS:  Reginald Stokes is a very nice 77 year old patient of Dr. Petra Kuba, who underwent coronary artery bypass grafting in September. He has done apparently reasonably well since then, but ever since the bypass grafting, he has had increasing problems with burning in his chest.  This is worse after he eats.  Even soda and crackers or water and crackers will cause him to burn approximately 15 minutes later particularly if he lays down.  This has gotten to the point that he has to sit up with eating.  He has been treating this with Maalox which helps immediately.  In addition to this, he has been on Pepcid and most recently on Nexium which helps some as well. Inspite of these medications, he does continue to have burning after eating. He notes that this always occurs after eating approximately 15 minutes, almost always when laying down.  He sleeps propped up and sits up in chairs after eating.  In addition to this pain, he has a second pain that he describes as exertional and gets better if he sits down and rests.  He came into the hospital on February 16, with substernal pain, aching, and burning which was indistinguishable from the pain that he had prior to his CABG.  This slowly resolved.  He had a stress Cardiolite done which unfortunately showed anterolateral wall ischemia and he is planned to have an angiogram tomorrow to evaluate this.  We are asked to see him regarding his chest pain to see  if there was some element of esophageal reflux associated with this.  ADMISSION MEDICATIONS: 1. Nexium 40 mg q.d. 2. Lisinopril HCTZ 20/12.5 q.d. 3. Metoprolol 50 mg 1/2 q.12h. 4. Coumadin 5 mg q.d. 5. Imdur 60 mg q.d. 6. Allopurinol 300 mg 1/2 q.d.  ALLERGIES:  No known drug allergies.  PAST MEDICAL HISTORY: 1. Coronary artery disease, it is not clear if he has had a heart attack    before, but he has had coronary artery bypass grafting in September. 2. Hypertension and hyperlipidemia. 3. Gout. 4. History of DVT and pulmonary embolus following his CABG in September    and has been on Coumadin since then.  PAST SURGICAL HISTORY:  Surgery on his left arm.  CABG.  FAMILY HISTORY:  Mother died of a stroke and father died of heart disease. He denies any family history of cancer.  SOCIAL HISTORY:  He is married and has one child.  He has not smoked for 30 years.  He does drink alcohol.  REVIEW OF SYSTEMS:  Remarkable for no change in bowel movements and primarily for his postprandial dyspepsia.  PHYSICAL EXAMINATION:  VITAL SIGNS:  The patient is afebrile and vital signs are unremarkable.  GENERAL:  A very pleasant white male in no acute distress.  He is on IV fluids, Lovenox, and nitroglycerin.  HEENT:  Eyes clear, nonicteric.  NECK:  Supple with no lymphadenopathy.  LUNGS:  Clear.  HEART:  Regular rate and rhythm without murmurs, rubs, or gallops.  ABDOMEN:  Soft, nontender, and nondistended.  ASSESSMENT:  Postprandial dyspepsia.  This is very suggestive of reflux, but not entirely clear why he has not gotten good relief with Nexium.  It may be that he has refractory reflux.  I think he does need a workup, but clearly issues going on with his heart particularly including evidence of anterolateral ischemia on the stress Cardiolite would suggest that he needs cardiac workup first.  PLAN:  Will follow him along with you.  Will go ahead and increase his Nexium to  b.i.d. and as soon as we feel that from a cardiac standpoint he is able to undergo endoscopy, we could go ahead and schedule this.  If the Nexium b.i.d. does not adequately control his symptoms, we could give a trial of Reglan in addition to this. Dictated by:   Llana Aliment. Randa Evens, M.D. Attending Physician:  Lyn Records. Iii DD:  08/12/01 TD:  08/12/01 Job: [id number]

## 2010-11-10 NOTE — H&P (Signed)
[place]. [facility]  Patient:    Reginald Stokes, Reginald Stokes Visit Number: [id number] MRN: [id number]          Service Type: MED Location: 1800 1843 02 Attending Physician:  Cathren Laine Dictated by:   Armanda Magic, M.D. Admit Date:  02/26/2001   CC:         Darci Needle, M.D.   History and Physical  PRIMARY CARDIOLOGIST: Dr. Verdis Prime.  CHIEF COMPLAINT: Chest pain and shortness of breath.  HISTORY OF PRESENT ILLNESS: This is a 77 year old black male, with no previous cardiac history, who recently was evaluated by Dr. Katrinka Blazing for atypical chest pain that was felt to be secondary to uncontrolled hypertension.  His work-up is currently in progress.  Apparently he is set up for an exercise Cardiolite in the near future.  Last evening he got home from a trip and was unloading the car when he got substernal chest pain with radiation to his back, associated with shortness of breath.  His pain was intermittent and the patient states his wife made him come to the emergency room.  He currently is pain-free.  Apparently he had chest pain during his trip and took "an acid pill," which relieved his pain.  PAST MEDICAL HISTORY:  1. Hypertension.  2. BPH.  3. Hyperlipidemia.  4. Hypokalemia.  PAST SURGICAL HISTORY: Left arm surgery.  FAMILY HISTORY: His mother died of a CVA.  His father died of hypertension and coronary artery disease.  SOCIAL HISTORY: He is married with one child.  He denies tobacco abuse for the past 30 years.  He occasionally drinks alcohol.  ALLERGIES: None.  MEDICATIONS:  1. Lisinopril/HCT 20/12.5 mg q.d.  2. Allopurinol 300 mg q.d.  3. Klor-Con.  4. Lipitor 10 mg q.d.  5. Norvasc 5 mg q.d.  6. Aspirin q.d.  REVIEW OF SYSTEMS: Other than stated in HPI, negative.  PHYSICAL EXAMINATION:  VITAL SIGNS: Blood pressure 136/84, heart rate 65, respiratory rate 20.  O2 saturation 91% on room air.  GENERAL: This is a well-developed,  well-nourished black male, in no acute distress.  HEENT: Benign.  NECK: Supple without lymphadenopathy, no bruits.  LUNGS: Clear to auscultation throughout.  HEART: Regular rate and rhythm.  No murmurs, rubs, or gallops.  Normal S1 and S2.  ABDOMEN: Soft, nontender, nondistended, active bowel sounds, no hepatosplenomegaly.  EXTREMITIES: No cyanosis, erythema, or edema.  Good distal pulses.  LABORATORY DATA: WBC 6.6, hemoglobin 14.1, hematocrit 42, platelet count 227,000.  Sodium 140, potassium 3.4, chloride 102, BUN 19, creatinine 1.4, glucose 126.  LFTs within normal limits.  CPK 193.  Troponin 0.08.  EKG shows normal sinus rhythm with nonspecific ST abnormality.  ASSESSMENT:  1. Atypical chest pain, currently pain-free.  Electrocardiogram shows     no evidence of ischemic.  The troponin is mildly elevated but CPK is     within normal limits.  2. Hypertension, well controlled.  3. Hyperlipidemia, on Lipitor.  4. Hypokalemia.  PLAN: Keep NPO.  Admit to telemetry bed for fluid overload myocardial infarction with serial enzymes.  Plan cardiac catheterization today per Dr. Katrinka Blazing.  Replete potassium and start Protonix 40 mg q.d. Dictated by:   Armanda Magic, M.D. Attending Physician:  Cathren Laine DD:  02/26/01 TD:  02/26/01 Job: [id number]

## 2010-11-10 NOTE — Consult Note (Signed)
[place]. [facility]  Patient:    Reginald Stokes, Reginald Stokes Visit Number: [id number] MRN: [id number]          Service Type: MED Location: [phone] Attending Physician:  Armanda Magic Dictated by:   Mikey Bussing, M.D. Proc. Date: 02/26/01 Admit Date:  02/26/2001   CC:         CVTS Office  [facility], Attn. Dr. Nash Shearer., M.D.   Consultation Report  REASON FOR CONSULTATION:  Severe three-vessel coronary artery disease with unstable angina.  PHYSICIAN REQUESTING CONSULTATION:  Darci Needle, M.D.  PRIMARY CARE PHYSICIAN:  Eino Farber., M.D.  CHIEF COMPLAINT:  Chest pain.  HISTORY OF PRESENT ILLNESS:  The patient is a 77 year old male who was admitted through the emergency department earlier today with complaints of chest pain.  The patient has had exertional substernal chest pain for several days.  The patient recently returned from an out-of-town trip and developed severe chest pain while unloading the car, which was associated with shortness of breath.  The pain was associated with some diaphoresis but no nausea and vomiting.  The patient has had exertional dyspnea over the past several weeks as well and has a notable decrease in exercise tolerance.  The patient was brought to the emergency department last night and enzymes were drawn and were negative.  He was placed on heparin and nitroglycerin and admitted for a cardiac catheterization.  This was performed today by Dr. Katrinka Blazing, which demonstrated a 95% stenosis of his LAD, 99% stenosis of the circumflex, and a 75% stenosis of the right coronary with normal left ventricular ejection fraction and LVEDP of 14 mmHg.  Due to his critical coronary anatomy and severe three-vessel disease, he was felt to be a candidate for surgical coronary revascularization.  PAST MEDICAL HISTORY: 1. Hypertension. 2. BPH. 3. Gout. 4. Status post left forearm  surgery for a forearm fracture with a plate placed    for internal fixation.  CURRENT MEDICATIONS: 1. Lisinopril/HCTZ 20/12.5 mg q.d. 2. Allopurinol 300 mg q.d. 3. Lipitor 10 mg q.d. 4. Norvasc 5 mg q.d. 5. Aspirin 325 mg q.d. 6. Potassium 20 mEq p.o. q.d.  ALLERGIES:  None.  SOCIAL HISTORY:  The patient is a Product/process development scientist and is married with one child.  He has not smoked cigarettes for 30 years.  Denies significant alcohol use.  FAMILY HISTORY:  Positive for hypertension and coronary artery disease in his father.  REVIEW OF SYSTEMS:  The patient recently underwent barium enema, as directed by Dr. Petra Kuba, for evaluation of heme-positive stool.  He states this was performed last week and the results are unknown to the patient.  He denies any intentional weight loss or change in bowel habits or abdominal pain.  He denies history of bleeding diathesis or easy bruisability.  He denies any history of claudication, DVT, TIA, or CVA.  He is right-hand dominant and he denies any trauma or fractures of his ribs.  He was last hospitalized for any significant problem over 10-15 years ago.  Review of systems is otherwise negative.  PHYSICAL EXAMINATION:  VITAL SIGNS:  Height 5 feet 8 inches, weight 205 pounds, blood pressure 130/70, pulse 64 and regular.  Room air saturation 935.  GENERAL:  A middle-aged, pleasant, well-developed black male in no distress sitting in his hospital room following cardiac catheterization earlier this afternoon.  He denies chest pain.  HEENT:  Dentition under good repair.  Pharynx clear.  Normocephalic.  EOMs normal.  NECK:  Supple without JVD, thyromegaly, or carotid bruits.  LYMPHATICS:  No palpable adenopathy of the cervical, supraclavicular, or axillary region.  LUNGS:  Clear bilaterally.  There is no thoracic deformity.  CARDIAC:  Regular rate and rhythm without S3 gallop or murmur.  ABDOMEN:  Soft, nontender, without mass or abdominal  bruit.  There is a compression dressing over the right groin.  EXTREMITIES:  Without swollen or tender joints, clubbing, or edema.  VASCULAR:  2+ pulses in both radials, both femorals, and both pedal areas. There is no venous insufficiency of the lower extremities.  SKIN:  Warm, clear, and dry without rash or lesion.  RECTAL:  Deferred.  NEUROLOGIC:  Alert and oriented x 3 with full motor function.  LABORATORY DATA:  Precath creatinine 1.4.  Hematocrit 42%, platelets 227,000, white blood cell count 6600.  Chest x-ray is pending.  Coronary arteriograms reveal the severe three-vessel disease with over 90% stenosis of the LAD, 90-95% stenosis of the circumflex, and high-grade proximal right coronary stenosis with preserved left ventricular function.  IMPRESSIONS AND RECOMMENDATIONS:  I agree with recommendation for a surgical coronary revascularization in this middle-aged male with severe three-vessel disease and preserved left ventricular function.  I discussed with the patient and family the indications and expected benefits of surgical coronary revascularization.  I reviewed the alternatives to surgical therapy for his coronary disease and the expected outcome of those alternative therapies.  I reviewed the risks associated with this operation including the risks of myocardial infarction, cerebrovascular accident, bleeding, infection, and death.  I also reviewed the major aspects of the operation including the location of the surgical incisions, the choice of conduit, use of general anesthesia and cardiopulmonary bypass, and the expected postoperative recovery.  He understands the implications of the operation and agrees to proceed with the operation as planned and informed consent.  Thank you very much for this consultation.  Dictated by:   Mikey Bussing, M.D. Attending Physician:  Armanda Magic DD:  02/26/01 TD:  02/26/01 Job: [id number]

## 2011-02-19 ENCOUNTER — Ambulatory Visit: Payer: Medicare Other | Attending: Neurology

## 2011-02-19 ENCOUNTER — Ambulatory Visit: Payer: Medicare Other | Admitting: Physical Therapy

## 2011-02-19 DIAGNOSIS — G20A1 Parkinson's disease without dyskinesia, without mention of fluctuations: Secondary | ICD-10-CM | POA: Insufficient documentation

## 2011-02-19 DIAGNOSIS — G20 Parkinson's disease: Secondary | ICD-10-CM | POA: Insufficient documentation

## 2011-02-19 DIAGNOSIS — R4789 Other speech disturbances: Secondary | ICD-10-CM | POA: Insufficient documentation

## 2011-02-19 DIAGNOSIS — R41841 Cognitive communication deficit: Secondary | ICD-10-CM | POA: Insufficient documentation

## 2011-02-19 DIAGNOSIS — R269 Unspecified abnormalities of gait and mobility: Secondary | ICD-10-CM | POA: Insufficient documentation

## 2011-02-19 DIAGNOSIS — Z5189 Encounter for other specified aftercare: Secondary | ICD-10-CM | POA: Insufficient documentation

## 2011-02-20 ENCOUNTER — Ambulatory Visit: Payer: Medicare Other | Admitting: Physical Therapy

## 2011-02-28 ENCOUNTER — Encounter: Payer: Medicare Other | Admitting: *Deleted

## 2011-03-01 ENCOUNTER — Encounter: Payer: Medicare Other | Admitting: Occupational Therapy

## 2011-03-07 ENCOUNTER — Ambulatory Visit: Payer: Medicare Other

## 2011-03-07 ENCOUNTER — Ambulatory Visit: Payer: Medicare Other | Admitting: Occupational Therapy

## 2011-03-07 ENCOUNTER — Encounter: Payer: Medicare Other | Admitting: Occupational Therapy

## 2011-03-07 ENCOUNTER — Ambulatory Visit: Payer: Medicare Other | Attending: Neurology | Admitting: Physical Therapy

## 2011-03-07 DIAGNOSIS — Z5189 Encounter for other specified aftercare: Secondary | ICD-10-CM | POA: Insufficient documentation

## 2011-03-07 DIAGNOSIS — G20 Parkinson's disease: Secondary | ICD-10-CM | POA: Insufficient documentation

## 2011-03-07 DIAGNOSIS — R4789 Other speech disturbances: Secondary | ICD-10-CM | POA: Insufficient documentation

## 2011-03-07 DIAGNOSIS — G20A1 Parkinson's disease without dyskinesia, without mention of fluctuations: Secondary | ICD-10-CM | POA: Insufficient documentation

## 2011-03-07 DIAGNOSIS — R269 Unspecified abnormalities of gait and mobility: Secondary | ICD-10-CM | POA: Insufficient documentation

## 2011-03-07 DIAGNOSIS — R4189 Other symptoms and signs involving cognitive functions and awareness: Secondary | ICD-10-CM | POA: Insufficient documentation

## 2011-03-09 ENCOUNTER — Ambulatory Visit: Payer: Medicare Other

## 2011-03-09 ENCOUNTER — Ambulatory Visit: Payer: Medicare Other | Admitting: Physical Therapy

## 2011-03-09 ENCOUNTER — Ambulatory Visit: Payer: Medicare Other | Admitting: Occupational Therapy

## 2011-03-12 ENCOUNTER — Ambulatory Visit: Payer: Medicare Other | Admitting: Occupational Therapy

## 2011-03-12 ENCOUNTER — Ambulatory Visit: Payer: Medicare Other

## 2011-03-12 ENCOUNTER — Ambulatory Visit: Payer: Medicare Other | Admitting: Physical Therapy

## 2011-03-14 ENCOUNTER — Ambulatory Visit: Payer: Medicare Other | Admitting: Occupational Therapy

## 2011-03-14 ENCOUNTER — Ambulatory Visit: Payer: Medicare Other

## 2011-03-14 ENCOUNTER — Ambulatory Visit: Payer: Medicare Other | Admitting: Physical Therapy

## 2011-03-19 ENCOUNTER — Ambulatory Visit: Payer: Medicare Other | Admitting: Physical Therapy

## 2011-03-19 ENCOUNTER — Ambulatory Visit: Payer: Medicare Other

## 2011-03-19 ENCOUNTER — Ambulatory Visit: Payer: Medicare Other | Admitting: Occupational Therapy

## 2011-03-22 ENCOUNTER — Ambulatory Visit: Payer: Medicare Other

## 2011-03-22 ENCOUNTER — Ambulatory Visit: Payer: Medicare Other | Admitting: Occupational Therapy

## 2011-03-22 ENCOUNTER — Ambulatory Visit: Payer: Medicare Other | Admitting: Physical Therapy

## 2011-03-26 ENCOUNTER — Ambulatory Visit: Payer: Medicare Other | Attending: Neurology

## 2011-03-26 DIAGNOSIS — G20A1 Parkinson's disease without dyskinesia, without mention of fluctuations: Secondary | ICD-10-CM | POA: Insufficient documentation

## 2011-03-26 DIAGNOSIS — R269 Unspecified abnormalities of gait and mobility: Secondary | ICD-10-CM | POA: Insufficient documentation

## 2011-03-26 DIAGNOSIS — R4789 Other speech disturbances: Secondary | ICD-10-CM | POA: Insufficient documentation

## 2011-03-26 DIAGNOSIS — G20 Parkinson's disease: Secondary | ICD-10-CM | POA: Insufficient documentation

## 2011-03-26 DIAGNOSIS — R4189 Other symptoms and signs involving cognitive functions and awareness: Secondary | ICD-10-CM | POA: Insufficient documentation

## 2011-03-26 DIAGNOSIS — Z5189 Encounter for other specified aftercare: Secondary | ICD-10-CM | POA: Insufficient documentation

## 2011-03-28 ENCOUNTER — Ambulatory Visit: Payer: Medicare Other

## 2011-03-28 ENCOUNTER — Ambulatory Visit: Payer: Medicare Other | Admitting: Occupational Therapy

## 2011-03-29 LAB — COMPREHENSIVE METABOLIC PANEL
ALT: 17 U/L (ref 0–53)
AST: 23 U/L (ref 0–37)
BUN: 17 mg/dL (ref 6–23)
Calcium: 9.4 mg/dL (ref 8.4–10.5)
Chloride: 104 mEq/L (ref 96–112)
Creatinine, Ser: 1.63 mg/dL — ABNORMAL HIGH (ref 0.4–1.5)
GFR calc Af Amer: 51 mL/min — ABNORMAL LOW (ref >60)
Glucose, Bld: 106 mg/dL — ABNORMAL HIGH (ref 70–99)
Potassium: 3.5 mEq/L (ref 3.5–5.1)
Total Protein: 6.5 g/dL (ref 6.0–8.3)

## 2011-03-29 LAB — DIFFERENTIAL
Basophils Absolute: 0 10*3/uL (ref 0.0–0.1)
Basophils Relative: 1 % (ref 0–1)
Eosinophils Absolute: 0.2 10*3/uL (ref 0.0–0.7)
Eosinophils Relative: 3 % (ref 0–5)
Lymphocytes Relative: 41 % (ref 12–46)
Lymphs Abs: 2 10*3/uL (ref 0.7–4.0)
Monocytes Absolute: 0.4 K/uL (ref 0.1–1.0)
Monocytes Relative: 8 % (ref 3–12)
Neutro Abs: 2.3 10*3/uL (ref 1.7–7.7)
Neutrophils Relative %: 48 % (ref 43–77)

## 2011-03-29 LAB — CBC
HCT: 40.7 % (ref 39.0–52.0)
Hemoglobin: 13.4 g/dL (ref 13.0–17.0)
MCHC: 32.8 g/dL (ref 30.0–36.0)
MCV: 88.7 fL (ref 78.0–100.0)
Platelets: 138 K/uL — ABNORMAL LOW (ref 150–400)
RBC: 4.59 MIL/uL (ref 4.22–5.81)
RDW: 13.8 % (ref 11.5–15.5)
WBC: 4.9 K/uL (ref 4.0–10.5)

## 2011-03-29 LAB — COMPREHENSIVE METABOLIC PANEL WITH GFR
Albumin: 4 g/dL (ref 3.5–5.2)
Alkaline Phosphatase: 51 U/L (ref 39–117)
CO2: 30 meq/L (ref 19–32)
GFR calc non Af Amer: 42 mL/min — ABNORMAL LOW (ref >60)
Sodium: 139 meq/L (ref 135–145)
Total Bilirubin: 0.8 mg/dL (ref 0.3–1.2)

## 2011-03-29 LAB — POCT CARDIAC MARKERS: Myoglobin, poc: 91.4 ng/mL (ref 12–200)

## 2011-04-02 ENCOUNTER — Ambulatory Visit: Payer: Medicare Other | Admitting: Occupational Therapy

## 2011-04-02 ENCOUNTER — Ambulatory Visit: Payer: Medicare Other | Admitting: Physical Therapy

## 2011-04-02 ENCOUNTER — Ambulatory Visit: Payer: Medicare Other

## 2011-04-04 ENCOUNTER — Ambulatory Visit: Payer: Medicare Other | Admitting: Physical Therapy

## 2011-04-04 ENCOUNTER — Ambulatory Visit: Payer: Medicare Other

## 2011-04-04 ENCOUNTER — Ambulatory Visit: Payer: Medicare Other | Admitting: Occupational Therapy

## 2011-04-05 LAB — I-STAT 8, (EC8 V) (CONVERTED LAB)
Acid-Base Excess: 7 — ABNORMAL HIGH
HCT: 47
Hemoglobin: 16
Operator id: 285841
Potassium: 3.1 — ABNORMAL LOW
Sodium: 139
TCO2: 34

## 2011-04-05 LAB — POCT CARDIAC MARKERS
CKMB, poc: <1 — ABNORMAL LOW
Myoglobin, poc: 215
Operator id: 285841
Operator id: 285841
Troponin i, poc: <0.05
Troponin i, poc: <0.05

## 2011-04-10 ENCOUNTER — Ambulatory Visit: Payer: Medicare Other | Admitting: Physical Therapy

## 2011-04-10 ENCOUNTER — Ambulatory Visit: Payer: Medicare Other | Admitting: Occupational Therapy

## 2011-04-10 ENCOUNTER — Ambulatory Visit: Payer: Medicare Other

## 2011-04-12 ENCOUNTER — Ambulatory Visit: Payer: Medicare Other

## 2011-04-12 ENCOUNTER — Ambulatory Visit: Payer: Medicare Other | Admitting: Physical Therapy

## 2011-04-12 ENCOUNTER — Ambulatory Visit: Payer: Medicare Other | Admitting: Occupational Therapy

## 2011-04-16 ENCOUNTER — Ambulatory Visit: Payer: Medicare Other

## 2011-04-16 ENCOUNTER — Ambulatory Visit: Payer: Medicare Other | Admitting: Occupational Therapy

## 2011-04-16 ENCOUNTER — Ambulatory Visit: Payer: Medicare Other | Admitting: Physical Therapy

## 2011-04-18 ENCOUNTER — Ambulatory Visit: Payer: Medicare Other | Admitting: Physical Therapy

## 2011-04-18 ENCOUNTER — Ambulatory Visit: Payer: Medicare Other | Admitting: Occupational Therapy

## 2011-04-23 ENCOUNTER — Ambulatory Visit: Payer: Medicare Other | Admitting: Physical Therapy

## 2011-04-23 ENCOUNTER — Encounter: Payer: Medicare Other | Admitting: Occupational Therapy

## 2011-04-25 ENCOUNTER — Encounter: Payer: Medicare Other | Admitting: Occupational Therapy

## 2011-04-25 ENCOUNTER — Ambulatory Visit: Payer: Medicare Other | Admitting: Physical Therapy

## 2013-09-18 ENCOUNTER — Other Ambulatory Visit: Payer: Self-pay | Admitting: *Deleted

## 2013-09-18 MED ORDER — TRAMADOL HCL 50 MG PO TABS: ORAL_TABLET | ORAL | Status: DC

## 2013-09-18 NOTE — Telephone Encounter (Signed)
[facility] 

## 2013-10-12 ENCOUNTER — Other Ambulatory Visit: Payer: Self-pay | Admitting: *Deleted

## 2013-10-12 ENCOUNTER — Ambulatory Visit
Admission: RE | Admit: 2013-10-12 | Discharge: 2013-10-12 | Disposition: A | Discharge status: Home / Self Care | Payer: PRIVATE HEALTH INSURANCE | Source: Ambulatory Visit | Attending: *Deleted | Admitting: *Deleted

## 2013-10-12 DIAGNOSIS — G20 Parkinson's disease: Secondary | ICD-10-CM

## 2013-10-12 MED ORDER — IOHEXOL 300 MG/ML  SOLN
50.0000 mL | Freq: Once | INTRAMUSCULAR | Status: AC | PRN
  Administered 2013-10-12: 50 mL via INTRAVENOUS

## 2013-10-13 ENCOUNTER — Ambulatory Visit
Admission: RE | Admit: 2013-10-13 | Discharge: 2013-10-13 | Disposition: A | Discharge status: Home / Self Care | Payer: PRIVATE HEALTH INSURANCE | Source: Ambulatory Visit | Attending: *Deleted | Admitting: *Deleted

## 2013-10-13 ENCOUNTER — Other Ambulatory Visit: Payer: Self-pay | Admitting: *Deleted

## 2013-10-13 DIAGNOSIS — M25572 Pain in left ankle and joints of left foot: Secondary | ICD-10-CM

## 2013-10-13 DIAGNOSIS — Z8739 Personal history of other diseases of the musculoskeletal system and connective tissue: Secondary | ICD-10-CM

## 2013-10-13 DIAGNOSIS — M25472 Effusion, left ankle: Secondary | ICD-10-CM

## 2013-11-12 ENCOUNTER — Ambulatory Visit: Payer: Medicare Other | Admitting: Neurology

## 2013-11-20 ENCOUNTER — Ambulatory Visit (INDEPENDENT_AMBULATORY_CARE_PROVIDER_SITE_OTHER): Payer: Medicare (Managed Care) | Admitting: Diagnostic Neuroimaging

## 2013-11-20 ENCOUNTER — Encounter: Payer: Self-pay | Admitting: Diagnostic Neuroimaging

## 2013-11-20 VITALS — BP 125/56 | HR 52 | Temp 97.5°F | Ht 68.0 in | Wt 136.0 lb

## 2013-11-20 DIAGNOSIS — G20 Parkinson's disease: Secondary | ICD-10-CM | POA: Insufficient documentation

## 2013-11-20 DIAGNOSIS — F03B0 Unspecified dementia, moderate, without behavioral disturbance, psychotic disturbance, mood disturbance, and anxiety: Secondary | ICD-10-CM | POA: Insufficient documentation

## 2013-11-20 DIAGNOSIS — F0390 Unspecified dementia without behavioral disturbance: Secondary | ICD-10-CM

## 2013-11-20 NOTE — Progress Notes (Signed)
[facility]  PATIENT: Reginald Stokes DOB: 05-May-1934  REFERRING CLINICIAN:  HISTORY FROM: patient and wife REASON FOR VISIT: new consult   HISTORICAL  CHIEF COMPLAINT:  Chief Complaint  Patient presents with  . Tremors    HISTORY OF PRESENT ILLNESS:   UPDATE 11/20/13: Since last visit, has transitioned to [facility]. Also his carb/levo has been gradually increased from 25/100 2 tabs TID; now on carb/levo CR 50/200 (3 tabs in AM, 2 tabs at lunch, 2 tabs in PM). Also with some reports of hallucinations vs sleep talking. Memory loss has slowly progressed.  UPDATE 08/02/11 ([facility]): Patient arrives alone, not sure why he is here.  Cannot give much information regarding his meds, symptoms, wearing off or side effects.  I reviewed prior notes for comparison.  PRIOR HPI (01/12/10; CM and MR): 80 year old black male returns today for followup. He has a history of Parkinson's disease as well as sleep disorder. He had been using his CPAP, but he tells me he does not use it every night. He has a history of glaucoma. Resting tremor is his biggest complaint with his Parkinson's, along with memory loss and dizziness. He is currently taking Sinemet 3 times a day before meals. He is also on 1 comtan. He has had no falls, he denies any daytime drowsiness. Complains with a lot of arthritic pain. Currently is wearing a boot on his left foot, had pin removal. Has some occasiona difficulty swallowing, no regular exercise. He has seen Dr. Linus Mako at [facility] for a second opinion. No changes were made to medications. See ROS.    REVIEW OF SYSTEMS: Full 14 system review of systems performed and notable only for memory loss confusion slurred speech difficulty swallowing insomnia depression diarrhea eye pain and weight loss.  ALLERGIES: No Known Allergies  HOME MEDICATIONS: Outpatient Prescriptions Prior to Visit  Medication Sig Dispense Refill  . traMADol (ULTRAM) 50 MG tablet  Take one tablet by mouth at bedtime as needed for pain  30 tablet  5   No facility-administered medications prior to visit.    PAST MEDICAL HISTORY: History reviewed. No pertinent past medical history.  PAST SURGICAL HISTORY: History reviewed. No pertinent past surgical history.  FAMILY HISTORY: Family History  Problem Relation Age of Onset  . Heart attack Mother   . Hypertension Mother   . Asthma Father     SOCIAL HISTORY:  History   Social History  . Marital Status: Married    Spouse Name: [name]    Number of Children: 2  . Years of Education: College   Occupational History  . Retired    Social History Main Topics  . Smoking status: Current Every Day Smoker -- 1.00 packs/day for 20 years    Types: Cigarettes  . Smokeless tobacco: Not on file  . Alcohol Use: No  . Drug Use: No  . Sexual Activity: Not on file   Other Topics Concern  . Not on file   Social History Narrative   Patient is currently living at [place].   Caffeine Use: 1 glass daily     PHYSICAL EXAM  Filed Vitals:   11/20/13 1026  BP: 125/56  Pulse: 52  Temp: 97.5 F (36.4 C)  TempSrc: Oral  Height: $Remove'5\' 8"'VuZHEpj$  (1.727 m)  Weight: 136 lb (61.689 kg)    Not recorded    Body mass index is 20.68 kg/(m^2).  General: Well developed, well  nourished, seated, in no evident  distress, MASKED FACIES Cardiovascular: regular rate and rhythm, no murmurs  Neurologic Exam  Mental Status: Awake and alert.  MMSE 13/30. AFT 5. Cranial Nerves: Pupils equal, briskly reactive to light. Extraocular movements full without nystagmus.  Visual fields full to confrontation.  Hearing intact and symmetric to finger snap.  Facial sensation intact.  Face, tongue, palate move normally and symmetrically.  Neck flexion and extension normal.  Motor: Normal strength in all tested extremity muscles. CONTINUOUS RESTING TREMOR OF BUE. BRADYKINESIA AND COGWHEELING IN BUE.  Sensory: Normal to light  tough. Gait and Station: STOOPED POSTURE. HAS SOFT BOOT ON RIGHT FOOT FOR HEEL INFX. CANNOT STAND WITHOUT ASSISTANCE.  Reflexes: 1+ and symmetric.  Toes downgoing.   DIAGNOSTIC DATA (LABS, IMAGING, TESTING) - I reviewed patient records, labs, notes, testing and imaging myself where available.  Lab Results  Component Value Date   WBC 6.2 12/20/2009   HGB 13.5 12/20/2009   HCT 39.5 12/20/2009   MCV 87.4 12/20/2009   PLT 134* 12/20/2009      Component Value Date/Time   NA 142 12/19/2009 0825   K 3.3* 12/19/2009 0825   CL 100 12/19/2009 0825   CO2 31 12/19/2009 0825   GLUCOSE 100* 12/19/2009 0825   BUN 24* 12/19/2009 0825   CREATININE 1.4 12/19/2009 0825   CALCIUM 8.8 12/19/2009 0825   PROT 6.5 06/08/2008 1629   ALBUMIN 4.0 06/08/2008 1629   AST 23 06/08/2008 1629   ALT 17 06/08/2008 1629   ALKPHOS 51 06/08/2008 1629   BILITOT 0.8 06/08/2008 1629   GFRNONAA 50* 12/19/2009 0825   GFRAA  Value: 60        The eGFR has been calculated using the MDRD equation. This calculation has not been validated in all clinical situations. eGFR's persistently <60 mL/min signify possible Chronic Kidney Disease.* 12/19/2009 0825   No results found for this basename: CHOL, HDL, LDLCALC, LDLDIRECT, TRIG, CHOLHDL   No results found for this basename: HGBA1C   No results found for this basename: VITAMINB12   No results found for this basename: TSH     ASSESSMENT AND PLAN  80 y.o. year old male here with parkinson's disease, starting in 2007, with significant progression, now with moderate dementia. Some reports of hallucinations (audiotry and visual) over the years. Could represent parkinson's disease with dementia vs DLB (dementia with lewy bodies).   PLAN: - continue carb/levo; if hallucinations persist, then slightly reduce carb/levo dosing - continue donepezil - continue physical therapy  Return if symptoms worsen or fail to improve.    Penni Bombard, MD 0/86/7619, 50:93 AM Certified in  Neurology, Neurophysiology and Neuroimaging  [facility] [address], [place], [place] [address] [phone]

## 2013-11-20 NOTE — Patient Instructions (Signed)
Continue current medications. 

## 2014-02-22 ENCOUNTER — Emergency Department (HOSPITAL_COMMUNITY): Payer: Medicare (Managed Care)

## 2014-02-22 ENCOUNTER — Encounter (HOSPITAL_COMMUNITY): Payer: Self-pay | Admitting: Emergency Medicine

## 2014-02-22 ENCOUNTER — Inpatient Hospital Stay (HOSPITAL_COMMUNITY)
Admission: EM | Admit: 2014-02-22 | Discharge: 2014-02-26 | DRG: 469 | Disposition: A | Discharge status: Skilled Nursing Facility | Payer: Medicare (Managed Care) | Attending: Internal Medicine | Admitting: Internal Medicine

## 2014-02-22 DIAGNOSIS — F17200 Nicotine dependence, unspecified, uncomplicated: Secondary | ICD-10-CM | POA: Diagnosis present

## 2014-02-22 DIAGNOSIS — Z993 Dependence on wheelchair: Secondary | ICD-10-CM | POA: Diagnosis not present

## 2014-02-22 DIAGNOSIS — S72033A Displaced midcervical fracture of unspecified femur, initial encounter for closed fracture: Secondary | ICD-10-CM | POA: Diagnosis present

## 2014-02-22 DIAGNOSIS — F0280 Dementia in other diseases classified elsewhere without behavioral disturbance: Secondary | ICD-10-CM | POA: Diagnosis present

## 2014-02-22 DIAGNOSIS — IMO0002 Reserved for concepts with insufficient information to code with codable children: Secondary | ICD-10-CM

## 2014-02-22 DIAGNOSIS — F03B18 Unspecified dementia, moderate, with other behavioral disturbance: Secondary | ICD-10-CM

## 2014-02-22 DIAGNOSIS — Z66 Do not resuscitate: Secondary | ICD-10-CM | POA: Diagnosis present

## 2014-02-22 DIAGNOSIS — E43 Unspecified severe protein-calorie malnutrition: Secondary | ICD-10-CM | POA: Diagnosis present

## 2014-02-22 DIAGNOSIS — Z86711 Personal history of pulmonary embolism: Secondary | ICD-10-CM

## 2014-02-22 DIAGNOSIS — Z951 Presence of aortocoronary bypass graft: Secondary | ICD-10-CM | POA: Diagnosis not present

## 2014-02-22 DIAGNOSIS — K219 Gastro-esophageal reflux disease without esophagitis: Secondary | ICD-10-CM | POA: Diagnosis present

## 2014-02-22 DIAGNOSIS — Z7901 Long term (current) use of anticoagulants: Secondary | ICD-10-CM | POA: Diagnosis not present

## 2014-02-22 DIAGNOSIS — N189 Chronic kidney disease, unspecified: Secondary | ICD-10-CM | POA: Diagnosis present

## 2014-02-22 DIAGNOSIS — W19XXXA Unspecified fall, initial encounter: Secondary | ICD-10-CM | POA: Diagnosis present

## 2014-02-22 DIAGNOSIS — Z515 Encounter for palliative care: Secondary | ICD-10-CM | POA: Diagnosis not present

## 2014-02-22 DIAGNOSIS — Z8249 Family history of ischemic heart disease and other diseases of the circulatory system: Secondary | ICD-10-CM | POA: Diagnosis not present

## 2014-02-22 DIAGNOSIS — S72009A Fracture of unspecified part of neck of unspecified femur, initial encounter for closed fracture: Secondary | ICD-10-CM | POA: Diagnosis present

## 2014-02-22 DIAGNOSIS — I2510 Atherosclerotic heart disease of native coronary artery without angina pectoris: Secondary | ICD-10-CM | POA: Diagnosis present

## 2014-02-22 DIAGNOSIS — Z86718 Personal history of other venous thrombosis and embolism: Secondary | ICD-10-CM

## 2014-02-22 DIAGNOSIS — M25559 Pain in unspecified hip: Secondary | ICD-10-CM | POA: Diagnosis present

## 2014-02-22 DIAGNOSIS — Y9210 Unspecified residential institution as the place of occurrence of the external cause: Secondary | ICD-10-CM | POA: Diagnosis present

## 2014-02-22 DIAGNOSIS — E870 Hyperosmolality and hypernatremia: Secondary | ICD-10-CM | POA: Diagnosis present

## 2014-02-22 DIAGNOSIS — Z23 Encounter for immunization: Secondary | ICD-10-CM

## 2014-02-22 DIAGNOSIS — I509 Heart failure, unspecified: Secondary | ICD-10-CM | POA: Diagnosis present

## 2014-02-22 DIAGNOSIS — Z825 Family history of asthma and other chronic lower respiratory diseases: Secondary | ICD-10-CM | POA: Diagnosis not present

## 2014-02-22 DIAGNOSIS — F0390 Unspecified dementia without behavioral disturbance: Secondary | ICD-10-CM | POA: Diagnosis present

## 2014-02-22 DIAGNOSIS — S72001A Fracture of unspecified part of neck of right femur, initial encounter for closed fracture: Secondary | ICD-10-CM

## 2014-02-22 DIAGNOSIS — R1310 Dysphagia, unspecified: Secondary | ICD-10-CM

## 2014-02-22 DIAGNOSIS — G3183 Dementia with Lewy bodies: Secondary | ICD-10-CM

## 2014-02-22 DIAGNOSIS — I129 Hypertensive chronic kidney disease with stage 1 through stage 4 chronic kidney disease, or unspecified chronic kidney disease: Secondary | ICD-10-CM | POA: Diagnosis present

## 2014-02-22 DIAGNOSIS — Z79899 Other long term (current) drug therapy: Secondary | ICD-10-CM | POA: Diagnosis not present

## 2014-02-22 DIAGNOSIS — M109 Gout, unspecified: Secondary | ICD-10-CM | POA: Diagnosis present

## 2014-02-22 DIAGNOSIS — D696 Thrombocytopenia, unspecified: Secondary | ICD-10-CM | POA: Diagnosis present

## 2014-02-22 DIAGNOSIS — F0391 Unspecified dementia with behavioral disturbance: Secondary | ICD-10-CM

## 2014-02-22 DIAGNOSIS — G20 Parkinson's disease: Secondary | ICD-10-CM

## 2014-02-22 HISTORY — DX: Other forms of angina pectoris: I20.8

## 2014-02-22 HISTORY — DX: Thyrotoxicosis, unspecified without thyrotoxic crisis or storm: E05.90

## 2014-02-22 HISTORY — DX: Atherosclerotic heart disease of native coronary artery without angina pectoris: I25.10

## 2014-02-22 HISTORY — DX: Parkinson's disease without dyskinesia, without mention of fluctuations: G20.A1

## 2014-02-22 HISTORY — DX: Major depressive disorder, single episode, unspecified: F32.9

## 2014-02-22 HISTORY — DX: Unspecified dementia, unspecified severity, without behavioral disturbance, psychotic disturbance, mood disturbance, and anxiety: F03.90

## 2014-02-22 HISTORY — DX: Depression, unspecified: F32.A

## 2014-02-22 HISTORY — DX: Gout, unspecified: M10.9

## 2014-02-22 HISTORY — DX: Other forms of angina pectoris: I20.89

## 2014-02-22 HISTORY — DX: Heart failure, unspecified: I50.9

## 2014-02-22 HISTORY — DX: Parkinson's disease: G20

## 2014-02-22 LAB — CBC
HCT: 42.6 % (ref 39.0–52.0)
HEMOGLOBIN: 13.8 g/dL (ref 13.0–17.0)
MCH: 27 pg (ref 26.0–34.0)
MCHC: 32.4 g/dL (ref 30.0–36.0)
MCV: 83.4 fL (ref 78.0–100.0)
Platelets: 129 10*3/uL — ABNORMAL LOW (ref 150–400)
RBC: 5.11 MIL/uL (ref 4.22–5.81)
RDW: 15.1 % (ref 11.5–15.5)
WBC: 4.9 10*3/uL (ref 4.0–10.5)

## 2014-02-22 LAB — COMPREHENSIVE METABOLIC PANEL
ALBUMIN: 3.5 g/dL (ref 3.5–5.2)
ALK PHOS: 85 U/L (ref 39–117)
ALT: 7 U/L (ref 0–53)
AST: 22 U/L (ref 0–37)
Anion gap: 14 (ref 5–15)
BILIRUBIN TOTAL: 0.9 mg/dL (ref 0.3–1.2)
BUN: 25 mg/dL — ABNORMAL HIGH (ref 6–23)
CHLORIDE: 112 meq/L (ref 96–112)
CO2: 25 mEq/L (ref 19–32)
Calcium: 8.9 mg/dL (ref 8.4–10.5)
Creatinine, Ser: 1.32 mg/dL (ref 0.50–1.35)
GFR calc non Af Amer: 50 mL/min — ABNORMAL LOW (ref >90)
GFR, EST AFRICAN AMERICAN: 58 mL/min — AB (ref >90)
GLUCOSE: 110 mg/dL — AB (ref 70–99)
POTASSIUM: 4.5 meq/L (ref 3.7–5.3)
Sodium: 151 mEq/L — ABNORMAL HIGH (ref 137–147)
Total Protein: 6.6 g/dL (ref 6.0–8.3)

## 2014-02-22 LAB — MRSA PCR SCREENING: MRSA by PCR: NEGATIVE

## 2014-02-22 LAB — PROTIME-INR
INR: 1.18 (ref 0.00–1.49)
Prothrombin Time: 15 seconds (ref 11.6–15.2)

## 2014-02-22 MED ORDER — ONDANSETRON HCL 4 MG/2ML IJ SOLN: 4.0000 mg | Freq: Four times a day (QID) | INTRAMUSCULAR | Status: DC | PRN

## 2014-02-22 MED ORDER — LISINOPRIL 20 MG PO TABS
20.0000 mg | ORAL_TABLET | Freq: Every day | ORAL | Status: DC
  Filled 2014-02-22 (×4): qty 1

## 2014-02-22 MED ORDER — SODIUM CHLORIDE 0.9 % IV BOLUS (SEPSIS)
500.0000 mL | Freq: Once | INTRAVENOUS | Status: AC
  Administered 2014-02-22: 500 mL via INTRAVENOUS

## 2014-02-22 MED ORDER — LORAZEPAM 0.5 MG PO TABS: 0.5000 mg | ORAL_TABLET | Freq: Four times a day (QID) | ORAL | Status: DC | PRN

## 2014-02-22 MED ORDER — COLCHICINE 0.6 MG PO TABS
0.6000 mg | ORAL_TABLET | Freq: Every day | ORAL | Status: DC
  Filled 2014-02-22 (×5): qty 1

## 2014-02-22 MED ORDER — ACETAMINOPHEN 325 MG PO TABS: 650.0000 mg | ORAL_TABLET | Freq: Four times a day (QID) | ORAL | Status: DC | PRN

## 2014-02-22 MED ORDER — FENTANYL CITRATE 0.05 MG/ML IJ SOLN
100.0000 ug | Freq: Once | INTRAMUSCULAR | Status: AC
Start: 2014-02-22 — End: 2014-02-22
  Administered 2014-02-22: 100 ug via INTRAVENOUS
  Filled 2014-02-22: qty 2

## 2014-02-22 MED ORDER — ASPIRIN 81 MG PO TABS
81.0000 mg | ORAL_TABLET | Freq: Every day | ORAL | Status: DC
  Filled 2014-02-22 (×2): qty 1

## 2014-02-22 MED ORDER — SODIUM CHLORIDE 0.9 % IV SOLN
INTRAVENOUS | Status: DC
  Administered 2014-02-22 – 2014-02-23 (×2): via INTRAVENOUS

## 2014-02-22 MED ORDER — SODIUM CHLORIDE 0.9 % IV SOLN
INTRAVENOUS | Status: DC
  Administered 2014-02-22: 12:00:00 via INTRAVENOUS

## 2014-02-22 MED ORDER — IPRATROPIUM-ALBUTEROL 0.5-2.5 (3) MG/3ML IN SOLN: 3.0000 mL | Freq: Every day | RESPIRATORY_TRACT | Status: DC | PRN

## 2014-02-22 MED ORDER — CARBIDOPA-LEVODOPA ER 50-200 MG PO TBCR
2.0000 | EXTENDED_RELEASE_TABLET | Freq: Three times a day (TID) | ORAL | Status: DC
  Filled 2014-02-22 (×14): qty 2

## 2014-02-22 MED ORDER — ONE-DAILY MULTI VITAMINS PO TABS
1.0000 | ORAL_TABLET | Freq: Every day | ORAL | Status: DC
  Filled 2014-02-22 (×4): qty 1

## 2014-02-22 MED ORDER — DONEPEZIL HCL 10 MG PO TABS
10.0000 mg | ORAL_TABLET | Freq: Every day | ORAL | Status: DC
  Filled 2014-02-22 (×5): qty 1

## 2014-02-22 MED ORDER — FENTANYL CITRATE 0.05 MG/ML IJ SOLN
100.0000 ug | INTRAMUSCULAR | Status: DC | PRN
  Administered 2014-02-22: 50 ug via INTRAVENOUS
  Filled 2014-02-22: qty 2

## 2014-02-22 MED ORDER — HYDROMORPHONE HCL PF 1 MG/ML IJ SOLN
0.5000 mg | INTRAMUSCULAR | Status: DC | PRN
  Administered 2014-02-23 – 2014-02-24 (×2): 0.5 mg via INTRAVENOUS
  Filled 2014-02-22 (×2): qty 1

## 2014-02-22 MED ORDER — ALLOPURINOL 100 MG PO TABS
200.0000 mg | ORAL_TABLET | Freq: Every day | ORAL | Status: DC
  Filled 2014-02-22 (×4): qty 2

## 2014-02-22 NOTE — ED Notes (Signed)
Returned from xray

## 2014-02-22 NOTE — ED Provider Notes (Signed)
CSN: [id number]     Arrival date & time 02/22/14  1018 History   None    Chief Complaint  Patient presents with  . Fall  . Hip Pain  . Leg Pain    Patient is a 80 y.o. male presenting with fall, hip pain, and leg pain. The history is provided by the patient.  Fall  Hip Pain  Leg Pain Hx of LBD, CAD, CHF, thyroid dz, gout who presents from [facility] after witnessed fall onto LEFT side this weekend x2.  Woke this morning unable to ambulate, can not extend right leg.  Pt is nonverbal at baseline, and besides ambulation and pain is at baseline, hx obtained from staff.  Did not strike head nor lose consciousness.  Home is not aware of prior orthopedic injuries.    Past Medical History  Diagnosis Date  . Parkinson disease   . Dementia   . Hyperthyroidism   . Depression   . Gout   . CAD (coronary artery disease)   . CHF (congestive heart failure)   . Stable angina    Past Surgical History  Procedure Laterality Date  . Coronary artery bypass graft     Family History  Problem Relation Age of Onset  . Heart attack Mother   . Hypertension Mother   . Asthma Father    History  Substance Use Topics  . Smoking status: Current Every Day Smoker -- 1.00 packs/day for 20 years    Types: Cigarettes  . Smokeless tobacco: Not on file  . Alcohol Use: No    Review of Systems  Unable to perform ROS: Patient nonverbal      Allergies  Review of patient's allergies indicates no known allergies.  Home Medications   Prior to Admission medications   Medication Sig Start Date End Date Taking? Authorizing Provider  allopurinol (ZYLOPRIM) 100 MG tablet Take 100 mg by mouth daily.    Historical Provider, MD  AMLODIPINE BESYLATE PO Take 5 mg by mouth daily.    Historical Provider, MD  aspirin 81 MG tablet Take 81 mg by mouth daily.    Historical Provider, MD  atorvastatin (LIPITOR) 20 MG tablet Take 20 mg by mouth at bedtime.    Historical Provider, MD  bisacodyl (DULCOLAX) 10  MG suppository Place 10 mg rectally as needed for moderate constipation.    Historical Provider, MD  carbidopa-levodopa (SINEMET CR) 50-200 MG per tablet Take 3 tablets by mouth daily before breakfast.    Historical Provider, MD  carbidopa-levodopa (SINEMET CR) 50-200 MG per tablet Take 2 tablets by mouth daily before lunch.    Historical Provider, MD  carbidopa-levodopa (SINEMET CR) 50-200 MG per tablet Take 2 tablets by mouth every evening.    Historical Provider, MD  cholecalciferol (VITAMIN D) 1000 UNITS tablet Take 1,000 Units by mouth daily.    Historical Provider, MD  colchicine (COLCRYS) 0.6 MG tablet Take 0.6 mg by mouth daily.    Historical Provider, MD  donepezil (ARICEPT) 10 MG tablet Take 10 mg by mouth at bedtime.    Historical Provider, MD  magnesium hydroxide (MILK OF MAGNESIA) 800 MG/5ML suspension Take 30 mLs by mouth daily as needed for constipation.    Historical Provider, MD  METOPROLOL SUCCINATE ER PO Take 25 mg by mouth daily.    Historical Provider, MD  Multiple Vitamin (MULTIVITAMIN) tablet Take 1 tablet by mouth daily.    Historical Provider, MD  nitroGLYCERIN (NITROSTAT) 0.4 MG SL tablet Place 0.4 mg under  the tongue every 5 (five) minutes as needed for chest pain.    Historical Provider, MD  Polyethyl Glycol-Propyl Glycol (SYSTANE ULTRA PF) 0.4-0.3 % SOLN Apply to eye 3 (three) times daily.    Historical Provider, MD  potassium chloride (KLOR-CON) 20 MEQ packet Take 20 mEq by mouth 2 (two) times daily.    Historical Provider, MD  predniSONE (DELTASONE) 10 MG tablet Take 10 mg by mouth daily with breakfast.    Historical Provider, MD  traMADol (ULTRAM) 50 MG tablet Take one tablet by mouth at bedtime as needed for pain 09/18/13   Tiffany L Reed, DO   BP 130/76  Pulse 79  Resp 30  SpO2 98% Physical Exam  Nursing note and vitals reviewed. Constitutional:  Nonverbal, grimaces in pain moving right hip  HENT:  Head: Normocephalic and atraumatic.  Nose: Nose normal.   Eyes: Conjunctivae are normal.  Neck: Normal range of motion. Neck supple. No tracheal deviation present.  Cardiovascular: Normal rate, regular rhythm and normal heart sounds.   No murmur heard. Palpable DP pulses, equal.   Pulmonary/Chest: Effort normal and breath sounds normal. No respiratory distress. He has no rales.  Abdominal: Soft. Bowel sounds are normal. He exhibits no distension and no mass. There is no tenderness.  Musculoskeletal: He exhibits no edema.  TTP to right hip.  Right leg held in flexion and resists extension.  No other source of injury/ deformity on exam  Neurological: He is alert.  Moves knee/ ankle and toes.  Sensation unable to be tested. DTRs  Equal. Not oriented (baseline)  Skin: Skin is warm and dry. No rash noted.  Psychiatric: He has a normal mood and affect.    ED Course  Procedures (including critical care time) Labs Review Labs Reviewed  COMPREHENSIVE METABOLIC PANEL - Abnormal; Notable for the following:    Sodium 151 (*)    Glucose, Bld 110 (*)    BUN 25 (*)    GFR calc non Af Amer 50 (*)    GFR calc Af Amer 58 (*)    All other components within normal limits  CBC - Abnormal; Notable for the following:    Platelets 129 (*)    All other components within normal limits  PROTIME-INR  URINALYSIS, ROUTINE W REFLEX MICROSCOPIC    Imaging Review Dg Pelvis 1-2 Views  02/22/2014   CLINICAL DATA:  Fall.  Right leg deformity.  EXAM: PELVIS - 1-2 VIEW  COMPARISON:  None.  FINDINGS: Frontal view of the pelvis shows diffuse bony demineralization. SI joints and symphysis pubis are within normal limits. Left femoral neck is intact. There is a fracture of the right femoral neck with a degree of impaction. Fracture line is at the subcapital position.  IMPRESSION: Subcapital right femoral neck fracture.   Electronically Signed   By: Kennith Center M.D.   On: 02/22/2014 11:42   Dg Femur Right  02/22/2014   CLINICAL DATA:  Fall with right leg deformity.  EXAM:  RIGHT FEMUR - 2 VIEW  COMPARISON:  None.  FINDINGS: Two view exam of the right femur shows diffuse bony demineralization. Subcapital right femoral neck fracture is evident. No other fracture within the right femur is visible.  IMPRESSION: Subcapital right femoral neck fracture.   Electronically Signed   By: Kennith Center M.D.   On: 02/22/2014 11:43     EKG Interpretation None      MDM   Final diagnoses:  Closed displaced fracture of right femoral neck, initial encounter  Nursing home pt with LBD presents after witnessed fall over weekend.  Hx obtained from nursing home.  Now non ambulatory.  At baseline otherwise.  Did not strike head, no LOC, no other sign of injury on head to toe exam.  N/v intact distal to injury.  XR with subcapital right fem neck fx, hip joint located. Analgesia provided. Ortho consulted, pending recs on intervention.  Will admit to hospitalist with ortho consulting.     Sofie Rower, MD 02/22/14 1537

## 2014-02-22 NOTE — ED Notes (Signed)
At baseline pt is nonverbal. Pt grimacing in pain on assessment. Pt is able to move toes on R foot.

## 2014-02-22 NOTE — H&P (Addendum)
[facility] History and Physical  Reginald Stokes ZOX:[id number] DOB: [birth year] DOA: 02/22/2014  Referring physician: EDP PCP: Thane Edu, MD   Chief Complaint: fall, hip fracture  HPI: Reginald Stokes is a 80 y.o. male with PMH of Advanced Dementia, Parkinson's disease, CAD s/p CABG in 2002, HTN, GERD, h/o Post op DVT/PE in 2002 presents to the ER after 2 falls. 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 He fell at the SNF and since was unable to move was brought to the ER. In ER, found to have R Femoral neck fracture,Ortho and TRH consulted. Pt unable to report any complaints at this time.   Review of Systems: unable to obtain due to Dementia Constitutional:  No weight loss, night sweats, Fevers, chills, fatigue.  HEENT:  No headaches, Difficulty swallowing,Tooth/dental problems,Sore throat,  No sneezing, itching, ear ache, nasal congestion, post nasal drip,  Cardio-vascular:  No chest pain, Orthopnea, PND, swelling in lower extremities, anasarca, dizziness, palpitations  GI:  No heartburn, indigestion, abdominal pain, nausea, vomiting, diarrhea, change in bowel habits, loss of appetite  Resp:  No shortness of breath with exertion or at rest. No excess mucus, no productive cough, No non-productive cough, No coughing up of blood.No change in color of mucus.No wheezing.No chest wall deformity  Skin:  no rash or lesions.  GU:  no dysuria, change in color of urine, no urgency or frequency. No flank pain.  Musculoskeletal:  No joint pain or swelling. No decreased range of motion. No back pain.  Psych:  No change in mood or affect. No depression or anxiety. No memory loss.   Past Medical History  Diagnosis Date  . Parkinson disease   .  Dementia   . Hyperthyroidism   . Depression   . Gout   . CAD (coronary artery disease)   . CHF (congestive heart failure)   . Stable angina    Past Surgical History  Procedure Laterality Date  . Coronary artery bypass graft     Social History:  reports that he has quit smoking. His smoking use included Cigarettes. He has a 20 pack-year smoking history. He does not have any smokeless tobacco history on file. He reports that he does not drink alcohol or use illicit drugs.  No Known Allergies  Family History  Problem Relation Age of Onset  . Heart attack Mother   . Hypertension Mother   . Asthma Father      Prior to Admission medications   Medication Sig Start Date End Date Taking? Authorizing Provider  allopurinol (ZYLOPRIM) 100 MG tablet Take 200 mg by mouth daily.    Yes Historical Provider, MD  Amino Acids-Protein Hydrolys (FEEDING SUPPLEMENT, PRO-STAT 64,) LIQD Take 30 mLs by mouth daily.   Yes Historical Provider, MD  Amino Acids-Protein Hydrolys (FEEDING SUPPLEMENT, PRO-STAT SUGAR FREE 64,) LIQD Take 30 mLs by mouth daily.   Yes Historical Provider, MD  aspirin 81 MG tablet Take 81 mg by mouth daily.   Yes Historical Provider, MD  bisacodyl (DULCOLAX) 10 MG suppository Place 10 mg rectally as needed for moderate constipation.   Yes Historical Provider, MD  carbidopa-levodopa (SINEMET CR) 50-200 MG per tablet Take 2 tablets by mouth 3 (three) times daily.    Yes Historical Provider, MD  cholecalciferol (VITAMIN D) 1000 UNITS tablet Take 1,000 Units by mouth daily.   Yes Historical Provider, MD  colchicine (COLCRYS) 0.6 MG tablet Take 0.6 mg by mouth daily.   Yes Historical Provider, MD  donepezil (ARICEPT) 10 MG tablet Take 10 mg by mouth at bedtime.   Yes Historical Provider, MD  furosemide (LASIX) 40 MG tablet Take 40 mg by mouth daily.   Yes Historical Provider, MD  ipratropium-albuterol (DUONEB) 0.5-2.5 (3) MG/3ML SOLN Take 3 mLs by nebulization daily as needed (for  wheezing).   Yes Historical Provider, MD  lisinopril (PRINIVIL,ZESTRIL) 20 MG tablet Take 20 mg by mouth daily.   Yes Historical Provider, MD  LORazepam (ATIVAN) 0.5 MG tablet Take 0.5 mg by mouth every 6 (six) hours as needed (for agitation).   Yes Historical Provider, MD  magnesium hydroxide (MILK OF MAGNESIA) 800 MG/5ML suspension Take 30 mLs by mouth daily as needed for constipation.   Yes Historical Provider, MD  Multiple Vitamin (MULTIVITAMIN) tablet Take 1 tablet by mouth daily.   Yes Historical Provider, MD  nitroGLYCERIN (NITROSTAT) 0.4 MG SL tablet Place 0.4 mg under the tongue every 5 (five) minutes as needed for chest pain.   Yes Historical Provider, MD  Polyethyl Glycol-Propyl Glycol (SYSTANE ULTRA PF) 0.4-0.3 % SOLN Place 1 drop into both eyes 3 (three) times daily.    Yes Historical Provider, MD  potassium chloride (KLOR-CON) 20 MEQ packet Take 20 mEq by mouth daily.    Yes Historical Provider, MD  Sodium Phosphates (RA SALINE ENEMA RE) Place 1 each rectally as needed (for constipation).   Yes Historical Provider, MD  traMADol (ULTRAM) 50 MG tablet Take 50 mg by mouth at bedtime as needed for moderate pain.   Yes Historical Provider, MD   Physical Exam: Filed Vitals:   02/22/14 1415 02/22/14 1422 02/22/14 1445 02/22/14 1526  BP:  147/67 147/95 142/83  Pulse: 66 82 68 87  Resp: SpO2: 100% 97% 98% 100%    Wt Readings from Last 3 Encounters:  11/20/13 61.689 kg (136 lb)    General: sleepy, opens eyes, unable to answer any questions, emaciated Eyes: small pupils reactive, normal lids, irises & conjunctiva ENT: grossly normal lips & tongue Neck: no LAD, masses or thyromegaly Cardiovascular: RRR, no m/r/g. No LE edema. Respiratory: CTA bilaterally, no w/r/r. Normal respiratory effort. Abdomen: soft, Nt, ND, BS present Skin: no rash or induration seen on limited exam Musculoskeletal: did not examine R hip due to Fracture Psychiatric: unable to assess due to  dementia Neurologic: grossly non-focal, increased muscle tone in extremities           Labs on Admission:  Basic Metabolic Panel:  Recent Labs Lab 02/22/14 1250  NA 151*  K 4.5  CL 112  CO2 25  GLUCOSE 110*  BUN 25*  CREATININE 1.32  CALCIUM 8.9   Liver Function Tests:  Recent Labs Lab 02/22/14 1250  AST 22  ALT 7  ALKPHOS 85  BILITOT 0.9  PROT 6.6  ALBUMIN 3.5   No results found for this basename: LIPASE, AMYLASE,  in the last 168 hours No results found for this basename: AMMONIA,  in the last 168 hours CBC:  Recent Labs Lab 02/22/14 1250  WBC 4.9  HGB 13.8  HCT 42.6  MCV 83.4  PLT 129*   Cardiac Enzymes: No results found for this basename: CKTOTAL, CKMB, CKMBINDEX, TROPONINI,  in the last 168 hours  BNP (last 3 results) No results  found for this basename: PROBNP,  in the last 8760 hours CBG: No results found for this basename: GLUCAP,  in the last 168 hours  Radiological Exams on Admission: Dg Pelvis 1-2 Views  02/22/2014   CLINICAL DATA:  Fall.  Right leg deformity.  EXAM: PELVIS - 1-2 VIEW  COMPARISON:  None.  FINDINGS: Frontal view of the pelvis shows diffuse bony demineralization. SI joints and symphysis pubis are within normal limits. Left femoral neck is intact. There is a fracture of the right femoral neck with a degree of impaction. Fracture line is at the subcapital position.  IMPRESSION: Subcapital right femoral neck fracture.   Electronically Signed   By: Kennith Center M.D.   On: 02/22/2014 11:42   Dg Femur Right  02/22/2014   CLINICAL DATA:  Fall with right leg deformity.  EXAM: RIGHT FEMUR - 2 VIEW  COMPARISON:  None.  FINDINGS: Two view exam of the right femur shows diffuse bony demineralization. Subcapital right femoral neck fracture is evident. No other fracture within the right femur is visible.  IMPRESSION: Subcapital right femoral neck fracture.   Electronically Signed   By: Kennith Center M.D.   On: 02/22/2014 11:43    EKG:  Independently reviewed. NSR, LVH with repolarization abnormalities  Assessment/Plan    Femoral neck fracture -Ortho, Dr.Olin consulted per EDP -NPO, IVF -stable, moderate cardiac risk but would not recommend any further workup for risk stratification at this time -Tylenol PRN, Narcotics-low dose for severe pain only    S/p CABG in 2002 -EKG with LVH and repolarization abnormalities    Parkinson's disease -continue Sinemet    Dementia-Advanced -continue Aricept, continue home dose of Lorazepam PRN     HTN -continue Lisinopril    DVT proph: start post   Code Status: DNR, per SNF documents DVT Prophylaxis: SCDs, needs to be ordered post op Family Communication: none at bedside Disposition Plan: inpatient  Time spent:  [facility] Pager [phone]  **Disclaimer: This note may have been dictated with voice recognition software. Similar sounding words can inadvertently be transcribed and this note may contain transcription errors which may not have been corrected upon publication of note.**

## 2014-02-22 NOTE — Consult Note (Signed)
Reason for Consult: Right femoral neck fracture Referring Physician:  Jomarie Longs, MD (Hospitalist Service)  Reginald Stokes is an 80 y.o. male.  HPI:  History obtained from charts due to medical co-morbidities as described.  No family has been around today per nursing   Reginald Stokes is a 80 y.o. male with PMH of Advanced Dementia, Parkinson's disease, CAD s/p CABG in 2002, HTN, GERD, h/o Post op DVT/PE in 2002 presents to the ER after 2 falls.  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  He fell at the SNF and since was unable to move was brought to the ER.  In ER, found to have R Femoral neck fracture,Ortho and [facility] consulted.  Pt unable to report any complaints at this time.   Past Medical History  Diagnosis Date  . Parkinson disease   . Dementia   . Hyperthyroidism   . Depression   . Gout   . CAD (coronary artery disease)   . CHF (congestive heart failure)   . Stable angina     Past Surgical History  Procedure Laterality Date  . Coronary artery bypass graft      Family History  Problem Relation Age of Onset  . Heart attack Mother   . Hypertension Mother   . Asthma Father     Social History:  reports that he has quit smoking. His smoking use included Cigarettes. He has a 20 pack-year smoking history. He does not have any smokeless tobacco history on file. He reports that he does not drink alcohol or use illicit drugs.  Allergies: No Known Allergies  Medications:  I have reviewed the patient's current medications. Scheduled: . [START ON 02/23/2014] allopurinol  200 mg Oral Daily  . aspirin  81 mg Oral Daily  . carbidopa-levodopa  2 tablet Oral TID  . colchicine  0.6 mg Oral Daily  . donepezil  10 mg Oral QHS  . [START ON 02/23/2014] lisinopril  20 mg  Oral Daily  . multivitamin  1 tablet Oral Daily    Results for orders placed during the hospital encounter of 02/22/14 (from the past 24 hour(s))  COMPREHENSIVE METABOLIC PANEL     Status: Abnormal   Collection Time    02/22/14 12:50 PM      Result Value Ref Range   Sodium 151 (*) 137 - 147 mEq/L   Potassium 4.5  3.7 - 5.3 mEq/L   Chloride 112  96 - 112 mEq/L   CO2 25  19 - 32 mEq/L   Glucose, Bld 110 (*) 70 - 99 mg/dL   BUN 25 (*) 6 - 23 mg/dL   Creatinine, Ser 4.09  0.50 - 1.35 mg/dL   Calcium 8.9  8.4 - 81.1 mg/dL   Total Protein 6.6  6.0 - 8.3 g/dL   Albumin 3.5  3.5 - 5.2 g/dL   AST 22  0 - 37 U/L   ALT 7  0 - 53 U/L   Alkaline Phosphatase 85  39 - 117 U/L   Total Bilirubin 0.9  0.3 - 1.2 mg/dL   GFR calc non Af Amer 50 (*) >90 mL/min   GFR calc Af Amer 58 (*) >90 mL/min   Anion gap 14  5 - 15  CBC  Status: Abnormal   Collection Time    02/22/14 12:50 PM      Result Value Ref Range   WBC 4.9  4.0 - 10.5 K/uL   RBC 5.11  4.22 - 5.81 MIL/uL   Hemoglobin 13.8  13.0 - 17.0 g/dL   HCT 19.1  47.8 - 29.5 %   MCV 83.4  78.0 - 100.0 fL   MCH 27.0  26.0 - 34.0 pg   MCHC 32.4  30.0 - 36.0 g/dL   RDW 62.1  30.8 - 65.7 %   Platelets 129 (*) 150 - 400 K/uL  PROTIME-INR     Status: None   Collection Time    02/22/14 12:50 PM      Result Value Ref Range   Prothrombin Time 15.0  11.6 - 15.2 seconds   INR 1.18  0.00 - 1.49  MRSA PCR SCREENING     Status: None   Collection Time    02/22/14  8:43 PM      Result Value Ref Range   MRSA by PCR NEGATIVE  NEGATIVE    X-ray: CLINICAL DATA: Fall with right leg deformity.  EXAM:  RIGHT FEMUR - 2 VIEW  COMPARISON: None.  FINDINGS:  Two view exam of the right femur shows diffuse bony  demineralization. Subcapital right femoral neck fracture is evident.  No other fracture within the right femur is visible.  IMPRESSION:  Subcapital right femoral neck fracture  CLINICAL DATA: Fall. Right leg deformity.  EXAM:  PELVIS - 1-2  VIEW  COMPARISON: None.  FINDINGS:  Frontal view of the pelvis shows diffuse bony demineralization. SI  joints and symphysis pubis are within normal limits. Left femoral  neck is intact. There is a fracture of the right femoral neck with a  degree of impaction. Fracture line is at the subcapital position.  IMPRESSION:  Subcapital right femoral neck fracture   ROS Unable to obtain from him directly due to advanced dementia  Blood pressure 157/75, pulse 113, temperature 97.7 F (36.5 C), temperature source Oral, resp. rate 16, SpO2 93.00%.  Physical Exam  In bed resting Mutters only  Pain with attempt at moving his right leg  General medical exam reviewed but deferred to medical admission  Assessment/Plan: Right subcapital femoral neck fracture  NPO for now Would need to discuss with family indications for fracture fixation Could go either way with fracture fixation for pain control versus non-operative management due to extensive pre-existing conditions  Per nurse this evening, concerns raised about decreased urine output Will plan to bladder scan and based on findings place foley or maintain condom catheter  Will follow up depending on family involvement tomorrow, will not post surgery until we have heard from them and had a chance to discuss above  Alyan Hartline D 02/22/2014, 11:39 PM

## 2014-02-22 NOTE — ED Notes (Signed)
Received pt via [facility] with c/o fall x 2 and pain to right hip and leg pain. Pt will not stretch out right leg and grimace with palpation to right hip.

## 2014-02-23 DIAGNOSIS — Z951 Presence of aortocoronary bypass graft: Secondary | ICD-10-CM

## 2014-02-23 DIAGNOSIS — F03918 Unspecified dementia, unspecified severity, with other behavioral disturbance: Secondary | ICD-10-CM

## 2014-02-23 DIAGNOSIS — S72009A Fracture of unspecified part of neck of unspecified femur, initial encounter for closed fracture: Secondary | ICD-10-CM

## 2014-02-23 DIAGNOSIS — F0391 Unspecified dementia with behavioral disturbance: Secondary | ICD-10-CM

## 2014-02-23 DIAGNOSIS — G20 Parkinson's disease: Secondary | ICD-10-CM

## 2014-02-23 LAB — BASIC METABOLIC PANEL
Anion gap: 12 (ref 5–15)
BUN: 24 mg/dL — AB (ref 6–23)
CHLORIDE: 112 meq/L (ref 96–112)
CO2: 25 mEq/L (ref 19–32)
Calcium: 8.7 mg/dL (ref 8.4–10.5)
Creatinine, Ser: 1.34 mg/dL (ref 0.50–1.35)
GFR calc Af Amer: 57 mL/min — ABNORMAL LOW (ref >90)
GFR calc non Af Amer: 49 mL/min — ABNORMAL LOW (ref >90)
GLUCOSE: 92 mg/dL (ref 70–99)
Potassium: 3.9 mEq/L (ref 3.7–5.3)
Sodium: 149 mEq/L — ABNORMAL HIGH (ref 137–147)

## 2014-02-23 LAB — URINALYSIS, ROUTINE W REFLEX MICROSCOPIC
BILIRUBIN URINE: NEGATIVE
GLUCOSE, UA: NEGATIVE mg/dL
Hgb urine dipstick: NEGATIVE
Ketones, ur: NEGATIVE mg/dL
Leukocytes, UA: NEGATIVE
Nitrite: NEGATIVE
PH: 5 (ref 5.0–8.0)
Protein, ur: NEGATIVE mg/dL
SPECIFIC GRAVITY, URINE: 1.018 (ref 1.005–1.030)
Urobilinogen, UA: 0.2 mg/dL (ref 0.0–1.0)

## 2014-02-23 LAB — CBC
HEMATOCRIT: 39.2 % (ref 39.0–52.0)
HEMOGLOBIN: 12.5 g/dL — AB (ref 13.0–17.0)
MCH: 27.4 pg (ref 26.0–34.0)
MCHC: 31.9 g/dL (ref 30.0–36.0)
MCV: 86 fL (ref 78.0–100.0)
Platelets: 116 10*3/uL — ABNORMAL LOW (ref 150–400)
RBC: 4.56 MIL/uL (ref 4.22–5.81)
RDW: 15.4 % (ref 11.5–15.5)
WBC: 4.5 10*3/uL (ref 4.0–10.5)

## 2014-02-23 NOTE — Progress Notes (Signed)
INITIAL NUTRITION ASSESSMENT  Pt meets criteria for SEVERE MALNUTRITION in the context of chronic illness as evidenced by severe fat and muscle mass depletion.  DOCUMENTATION CODES Per approved criteria  -Severe malnutrition in the context of chronic illness   INTERVENTION: Once diet advances, will provide/order oral supplement for pt.   NUTRITION DIAGNOSIS: Increased nutrient needs related to chronic illness as evidenced by estimated nutrition needs.   Goal: Pt to meet >/= 90% of their estimated nutrition needs   Monitor:  Diet advancement, weight trends, labs  Reason for Assessment: Low Braden Score  80 y.o. male  Admitting Dx: Femoral neck fracture  ASSESSMENT: PMH of Advanced Dementia, Parkinson's disease, CAD s/p CABG in 2002, HTN, GERD, h/o Post op DVT/PE in 2002 presents to the ER after 2 falls. He is a Research scientist (medical) SNF. Pt found to have R Femoral neck fracture.  Per MD note, will discuss with family about fracture fixation for pain control versus non-operative management due to extensive pre-existing conditions.   Pt was unable to give nutrition hx. Pt did respond yes/no to some questions asked. Pt was able to tell me that he currently has an appetite and is hungry, however pt is NPO.   Pt came from [facility], spoke with the RN who had the pt. She reports pt's appetite has varied over the past weeks. Pt would sometimes eat all of his food at meals and then at other times eat only 25%. RN reports this may be part of his advanced dementia. RN does report the pt has been losing weight with his usual body weight of 142 lbs, which he last weighed 8/6. Pt with a 4.2% weight loss. RN reports pt has also been taking oral supplements, Med Pass. Pt was on a dysphagia 3 diet with assists on feeds.   Will order oral supplement for pt once diet advances.  Nutrition Focused Physical Exam:  Subcutaneous Fat:  Orbital Region: N/A Upper Arm Region: Moderate  depletion Thoracic and Lumbar Region: Severe depletion  Muscle:  Temple Region: WNL Clavicle Bone Region: Severe depletion Clavicle and Acromion Bone Region: Severe depletion Scapular Bone Region: Severe depletion Dorsal Hand: N/A Patellar Region: Moderate depletion Anterior Thigh Region: Moderate depletion Posterior Calf Region: N/A  Edema: none  Labs: High BUN and sodium. Low GFR.  Height: Ht Readings from Last 1 Encounters:  11/20/13  (1.727 m)    Weight: Wt Readings from Last 1 Encounters:  11/20/13 136 lb (61.689 kg)    Ideal Body Weight: 154 lbs  % Ideal Body Weight: 88%  Wt Readings from Last 10 Encounters:  11/20/13 136 lb (61.689 kg)    Usual Body Weight: 142 lbs  % Usual Body Weight: 96%  BMI: 20.6  Estimated Nutritional Needs: Kcal: 1800-2000 Protein: 75-90 grams Fluid: 1.8- 2 L/day  Skin: no issues noted  Diet Order: NPO  EDUCATION NEEDS: -Education not appropriate at this time   Intake/Output Summary (Last 24 hours) at 02/23/14 0938 Last data filed at 02/23/14 0900  Gross per 24 hour  Intake 1338.33 ml  Output    150 ml  Net 1188.33 ml    Last BM: PTA   Labs:   Recent Labs Lab 02/22/14 1250 02/23/14 0517  NA 151* 149*  K 4.5 3.9  CL 112 112  CO2 25 25  BUN 25* 24*  CREATININE 1.32 1.34  CALCIUM 8.9 8.7  GLUCOSE 110* 92    CBG (last 3)  No results found for this  basename: GLUCAP,  in the last 72 hours  Scheduled Meds: . allopurinol  200 mg Oral Daily  . aspirin  81 mg Oral Daily  . carbidopa-levodopa  2 tablet Oral TID  . colchicine  0.6 mg Oral Daily  . donepezil  10 mg Oral QHS  . lisinopril  20 mg Oral Daily  . multivitamin  1 tablet Oral Daily    Continuous Infusions: . sodium chloride 50 mL/hr at 02/22/14 1850    Past Medical History  Diagnosis Date  . Parkinson disease   . Dementia   . Hyperthyroidism   . Depression   . Gout   . CAD (coronary artery disease)   . CHF (congestive heart  failure)   . Stable angina     Past Surgical History  Procedure Laterality Date  . Coronary artery bypass graft      Marijean Niemann, MS, [facility] Pager # [phone] After hours/ weekend pager # [phone]

## 2014-02-23 NOTE — Progress Notes (Signed)
Utilization review completed.  

## 2014-02-23 NOTE — Progress Notes (Signed)
[facility]                                                                              Patient Demographics  Reginald Stokes, is a 80 y.o. male, DOB - [birth year], UEA:[id number]  Admit date - 02/22/2014   Admitting Physician Zannie Cove, MD  Outpatient Primary MD for the patient is Thane Edu, MD  LOS - 1   Chief Complaint  Patient presents with  . Fall  . Hip Pain  . Leg Pain      Interim history 80 year old with a history of advanced dementia, Parkinson percent after falling at [facility] nursing home. Patient was brought to the emergency department and imaging was conducted showing a right subcapital femoral neck fracture. Orthopedics was consulted however there is still question of non-operative versus operative treatment.  Assessment & Plan   Right femoral neck fracture -Orthopedics consulted and appreciated -Pending decision regarding surgery -Currently n.p.o., continue IV fluids, pain medications as needed -Given patient's age as well as comorbidities, patient is moderate risk  Coronary artery disease status post CABG in 2002 -EKG shows left ventricular hypertrophy with repolarization abnormalities -Troponin  Parkinson's disease -Continue Sinemet  Advanced dementia -Continue Aricept as well as lorazepam as needed  Hypertension -Continue lisinopril  Thrombocytopenia -Appears to be chronically low, we'll continue to monitor CBC  Hypernatremia -Trending downward, continue to monitor BMP -Continue IVF  Code Status: DO NOT RESUSCITATE  Family Communication: None at bedside  Disposition Plan: Admitted  Time Spent in minutes   30 minutes  Procedures  None  Consults   Orthopedic surgery  DVT Prophylaxis  SCDs  Lab Results  Component Value Date   PLT 116* 02/23/2014    Medications  Scheduled Meds: . allopurinol  200 mg Oral Daily  . aspirin  81 mg Oral Daily  . carbidopa-levodopa  2 tablet Oral TID  . colchicine  0.6 mg  Oral Daily  . donepezil  10 mg Oral QHS  . lisinopril  20 mg Oral Daily  . multivitamin  1 tablet Oral Daily   Continuous Infusions: . sodium chloride 50 mL/hr at 02/22/14 1850   PRN Meds:.acetaminophen, HYDROmorphone (DILAUDID) injection, ipratropium-albuterol, LORazepam, ondansetron (ZOFRAN) IV  Antibiotics    Anti-infectives   None        Subjective:   Reginald Stokes seen and examined today.  Patient awake and arousable however noncommunicative. Patient does not follow commands.   Objective:   Filed Vitals:   02/22/14 1526 02/22/14 1604 02/22/14 2046 02/23/14 0444  BP: 142/83  157/75 146/64  Pulse: 87  113 66  Temp:  97.8 F (36.6 C) 97.7 F (36.5 C) 98.1 F (36.7 C)  TempSrc:  Oral  Oral  Resp: SpO2: 100%  93% 95%    Wt Readings from Last 3 Encounters:  11/20/13 61.689 kg (136 lb)     Intake/Output Summary (Last 24 hours) at 02/23/14 1302 Last data filed at 02/23/14 0900  Gross per 24 hour  Intake 1338.33 ml  Output    150 ml  Net 1188.33 ml    Exam  General: Well developed, malnourished, NAD, appears stated age  HEENT: NCAT, PERRLA, EOMI,  Anicteic Sclera, mucous membranes moist.   Neck: Supple, no JVD, no masses  Cardiovascular: S1 S2 auscultated, regular rate and rhythm.  Respiratory: Clear to auscultation bilaterally with equal chest rise  Abdomen: Soft, nontender, nondistended, + bowel sounds  Extremities: warm dry without cyanosis clubbing or edema  Neuro: Awake and arousable. Does not follow commands.    Skin: Without rashes exudates or nodules  Data Review   Micro Results Recent Results (from the past 240 hour(s))  MRSA PCR SCREENING     Status: None   Collection Time    02/22/14  8:43 PM      Result Value Ref Range Status   MRSA by PCR NEGATIVE  NEGATIVE Final   Comment:            The GeneXpert MRSA Assay (FDA     approved for NASAL specimens     only), is one component of a     comprehensive MRSA colonization       surveillance program. It is not     intended to diagnose MRSA     infection nor to guide or     monitor treatment for     MRSA infections.    Radiology Reports Dg Pelvis 1-2 Views  02/22/2014   CLINICAL DATA:  Fall.  Right leg deformity.  EXAM: PELVIS - 1-2 VIEW  COMPARISON:  None.  FINDINGS: Frontal view of the pelvis shows diffuse bony demineralization. SI joints and symphysis pubis are within normal limits. Left femoral neck is intact. There is a fracture of the right femoral neck with a degree of impaction. Fracture line is at the subcapital position.  IMPRESSION: Subcapital right femoral neck fracture.   Electronically Signed   By: Kennith Center M.D.   On: 02/22/2014 11:42   Dg Femur Right  02/22/2014   CLINICAL DATA:  Fall with right leg deformity.  EXAM: RIGHT FEMUR - 2 VIEW  COMPARISON:  None.  FINDINGS: Two view exam of the right femur shows diffuse bony demineralization. Subcapital right femoral neck fracture is evident. No other fracture within the right femur is visible.  IMPRESSION: Subcapital right femoral neck fracture.   Electronically Signed   By: Kennith Center M.D.   On: 02/22/2014 11:43    CBC  Recent Labs Lab 02/22/14 1250 02/23/14 0517  WBC 4.9 4.5  HGB 13.8 12.5*  HCT 42.6 39.2  PLT 129* 116*  MCV 83.4 86.0  MCH 27.0 27.4  MCHC 32.4 31.9  RDW 15.1 15.4    Chemistries   Recent Labs Lab 02/22/14 1250 02/23/14 0517  NA 151* 149*  K 4.5 3.9  CL 112 112  CO2 25 25  GLUCOSE 110* 92  BUN 25* 24*  CREATININE 1.32 1.34  CALCIUM 8.9 8.7  AST 22  --   ALT 7  --   ALKPHOS 85  --   BILITOT 0.9  --    ------------------------------------------------------------------------------------------------------------------ CrCl is unknown because both a height and weight (above a minimum accepted value) are required for this calculation. ------------------------------------------------------------------------------------------------------------------ No  results found for this basename: HGBA1C,  in the last 72 hours ------------------------------------------------------------------------------------------------------------------ No results found for this basename: CHOL, HDL, LDLCALC, TRIG, CHOLHDL, LDLDIRECT,  in the last 72 hours ------------------------------------------------------------------------------------------------------------------ No results found for this basename: TSH, T4TOTAL, FREET3, T3FREE, THYROIDAB,  in the last 72 hours ------------------------------------------------------------------------------------------------------------------ No results found for this basename: VITAMINB12, FOLATE, FERRITIN, TIBC, IRON, RETICCTPCT,  in the last 72 hours  Coagulation profile  Recent Labs Lab 02/22/14  1250  INR 1.18    No results found for this basename: DDIMER,  in the last 72 hours  Cardiac Enzymes No results found for this basename: CK, CKMB, TROPONINI, MYOGLOBIN,  in the last 168 hours ------------------------------------------------------------------------------------------------------------------ No components found with this basename: POCBNP,     Reginald Stokes D.O. on 02/23/2014 at 1:02 PM  Between 7am to 7pm - Pager - [phone]  After 7pm go to [email or web address] - password [email or web address]  And look for the night coverage person covering for me after hours  [facility] Office  [phone]

## 2014-02-24 ENCOUNTER — Encounter (HOSPITAL_COMMUNITY): Payer: Self-pay | Admitting: Certified Registered"

## 2014-02-24 ENCOUNTER — Encounter (HOSPITAL_COMMUNITY)
Admission: EM | Disposition: A | Discharge status: Skilled Nursing Facility | Payer: Self-pay | Source: Home / Self Care | Attending: Internal Medicine

## 2014-02-24 ENCOUNTER — Inpatient Hospital Stay (HOSPITAL_COMMUNITY): Payer: Medicare (Managed Care)

## 2014-02-24 ENCOUNTER — Inpatient Hospital Stay (HOSPITAL_COMMUNITY): Payer: Medicare (Managed Care) | Admitting: Certified Registered"

## 2014-02-24 ENCOUNTER — Encounter (HOSPITAL_COMMUNITY): Payer: Medicare (Managed Care) | Admitting: Certified Registered"

## 2014-02-24 DIAGNOSIS — E43 Unspecified severe protein-calorie malnutrition: Secondary | ICD-10-CM | POA: Insufficient documentation

## 2014-02-24 HISTORY — PX: HIP ARTHROPLASTY: SHX981

## 2014-02-24 LAB — BASIC METABOLIC PANEL
ANION GAP: 13 (ref 5–15)
BUN: 29 mg/dL — AB (ref 6–23)
CHLORIDE: 116 meq/L — AB (ref 96–112)
CO2: 24 mEq/L (ref 19–32)
CREATININE: 1.55 mg/dL — AB (ref 0.50–1.35)
Calcium: 8.4 mg/dL (ref 8.4–10.5)
GFR calc Af Amer: 48 mL/min — ABNORMAL LOW (ref >90)
GFR calc non Af Amer: 41 mL/min — ABNORMAL LOW (ref >90)
Glucose, Bld: 86 mg/dL (ref 70–99)
Potassium: 4 mEq/L (ref 3.7–5.3)
Sodium: 153 mEq/L — ABNORMAL HIGH (ref 137–147)

## 2014-02-24 LAB — CBC
HEMATOCRIT: 39.8 % (ref 39.0–52.0)
HEMOGLOBIN: 13 g/dL (ref 13.0–17.0)
MCH: 27.6 pg (ref 26.0–34.0)
MCHC: 32.7 g/dL (ref 30.0–36.0)
MCV: 84.5 fL (ref 78.0–100.0)
Platelets: 101 10*3/uL — ABNORMAL LOW (ref 150–400)
RBC: 4.71 MIL/uL (ref 4.22–5.81)
RDW: 15.3 % (ref 11.5–15.5)
WBC: 5.5 10*3/uL (ref 4.0–10.5)

## 2014-02-24 SURGERY — HEMIARTHROPLASTY, HIP, DIRECT ANTERIOR APPROACH, FOR FRACTURE
Anesthesia: Regional | Laterality: Right

## 2014-02-24 MED ORDER — CEFAZOLIN SODIUM 1-5 GM-% IV SOLN
1.0000 g | Freq: Four times a day (QID) | INTRAVENOUS | Status: AC
  Administered 2014-02-24 – 2014-02-25 (×2): 1 g via INTRAVENOUS
  Filled 2014-02-24 (×2): qty 50

## 2014-02-24 MED ORDER — ASPIRIN 81 MG PO CHEW
81.0000 mg | CHEWABLE_TABLET | Freq: Every day | ORAL | Status: DC
  Filled 2014-02-24: qty 1

## 2014-02-24 MED ORDER — DEXTROSE-NACL 5-0.45 % IV SOLN
INTRAVENOUS | Status: DC
  Administered 2014-02-24 – 2014-02-25 (×2): via INTRAVENOUS
  Administered 2014-02-25: 250 mL/h via INTRAVENOUS
  Administered 2014-02-25 – 2014-02-26 (×2): via INTRAVENOUS

## 2014-02-24 MED ORDER — LIDOCAINE HCL (CARDIAC) 20 MG/ML IV SOLN
INTRAVENOUS | Status: AC
  Filled 2014-02-24: qty 5

## 2014-02-24 MED ORDER — FENTANYL CITRATE 0.05 MG/ML IJ SOLN
25.0000 ug | INTRAMUSCULAR | Status: DC | PRN
Start: 2014-02-24 — End: 2014-02-24

## 2014-02-24 MED ORDER — PROPOFOL 10 MG/ML IV BOLUS
INTRAVENOUS | Status: DC | PRN
  Administered 2014-02-24: 100 mg via INTRAVENOUS

## 2014-02-24 MED ORDER — MIDAZOLAM HCL 2 MG/2ML IJ SOLN
INTRAMUSCULAR | Status: AC
  Filled 2014-02-24: qty 2

## 2014-02-24 MED ORDER — MORPHINE SULFATE 2 MG/ML IJ SOLN
0.5000 mg | INTRAMUSCULAR | Status: DC | PRN
  Administered 2014-02-25 (×2): 0.5 mg via INTRAVENOUS
  Filled 2014-02-24 (×2): qty 1

## 2014-02-24 MED ORDER — PROPOFOL 10 MG/ML IV BOLUS
INTRAVENOUS | Status: AC
  Filled 2014-02-24: qty 20

## 2014-02-24 MED ORDER — MENTHOL 3 MG MT LOZG
1.0000 | LOZENGE | OROMUCOSAL | Status: DC | PRN
  Filled 2014-02-24: qty 9

## 2014-02-24 MED ORDER — SUCCINYLCHOLINE CHLORIDE 20 MG/ML IJ SOLN
INTRAMUSCULAR | Status: DC | PRN
  Administered 2014-02-24: 120 mg via INTRAVENOUS

## 2014-02-24 MED ORDER — ROCURONIUM BROMIDE 50 MG/5ML IV SOLN
INTRAVENOUS | Status: AC
  Filled 2014-02-24: qty 1

## 2014-02-24 MED ORDER — LIDOCAINE HCL (CARDIAC) 20 MG/ML IV SOLN
INTRAVENOUS | Status: DC | PRN
  Administered 2014-02-24: 60 mg via INTRAVENOUS

## 2014-02-24 MED ORDER — DOCUSATE SODIUM 100 MG PO CAPS: 100.0000 mg | ORAL_CAPSULE | Freq: Two times a day (BID) | ORAL | Status: DC

## 2014-02-24 MED ORDER — SODIUM CHLORIDE 0.9 % IR SOLN
Status: DC | PRN
  Administered 2014-02-24: 1000 mL

## 2014-02-24 MED ORDER — ENOXAPARIN SODIUM 30 MG/0.3ML ~~LOC~~ SOLN
30.0000 mg | SUBCUTANEOUS | Status: DC
  Administered 2014-02-25 – 2014-02-26 (×2): 30 mg via SUBCUTANEOUS
  Filled 2014-02-24 (×3): qty 0.3

## 2014-02-24 MED ORDER — PHENYLEPHRINE HCL 10 MG/ML IJ SOLN
10.0000 mg | INTRAMUSCULAR | Status: DC | PRN
  Administered 2014-02-24: 5 ug/min via INTRAVENOUS

## 2014-02-24 MED ORDER — SODIUM CHLORIDE 0.9 % IV SOLN
INTRAVENOUS | Status: DC
  Administered 2014-02-24: 22:00:00 via INTRAVENOUS

## 2014-02-24 MED ORDER — ONDANSETRON HCL 4 MG/2ML IJ SOLN
INTRAMUSCULAR | Status: AC
  Filled 2014-02-24: qty 2

## 2014-02-24 MED ORDER — ONDANSETRON HCL 4 MG/2ML IJ SOLN
INTRAMUSCULAR | Status: DC | PRN
  Administered 2014-02-24: 4 mg via INTRAVENOUS

## 2014-02-24 MED ORDER — HYDROCODONE-ACETAMINOPHEN 5-325 MG PO TABS: 1.0000 | ORAL_TABLET | Freq: Four times a day (QID) | ORAL | Status: DC | PRN

## 2014-02-24 MED ORDER — LACTATED RINGERS IV SOLN
INTRAVENOUS | Status: DC | PRN
  Administered 2014-02-24 (×2): via INTRAVENOUS

## 2014-02-24 MED ORDER — NEOSTIGMINE METHYLSULFATE 10 MG/10ML IV SOLN
INTRAVENOUS | Status: AC
  Filled 2014-02-24: qty 1

## 2014-02-24 MED ORDER — POLYETHYLENE GLYCOL 3350 17 G PO PACK
17.0000 g | PACK | Freq: Every day | ORAL | Status: DC | PRN
  Filled 2014-02-24: qty 1

## 2014-02-24 MED ORDER — ACETAMINOPHEN 650 MG RE SUPP: 650.0000 mg | Freq: Four times a day (QID) | RECTAL | Status: DC | PRN

## 2014-02-24 MED ORDER — CEFAZOLIN SODIUM-DEXTROSE 2-3 GM-% IV SOLR
INTRAVENOUS | Status: DC | PRN
  Administered 2014-02-24: 2 g via INTRAVENOUS

## 2014-02-24 MED ORDER — ACETAMINOPHEN 325 MG PO TABS: 650.0000 mg | ORAL_TABLET | Freq: Four times a day (QID) | ORAL | Status: DC | PRN

## 2014-02-24 MED ORDER — GLYCOPYRROLATE 0.2 MG/ML IJ SOLN
INTRAMUSCULAR | Status: AC
  Filled 2014-02-24: qty 3

## 2014-02-24 MED ORDER — TRAMADOL-ACETAMINOPHEN 37.5-325 MG PO TABS: 1.0000 | ORAL_TABLET | Freq: Four times a day (QID) | ORAL | Status: DC | PRN

## 2014-02-24 MED ORDER — ONDANSETRON HCL 4 MG PO TABS
4.0000 mg | ORAL_TABLET | Freq: Four times a day (QID) | ORAL | Status: DC | PRN
Start: 2014-02-24 — End: 2014-02-26

## 2014-02-24 MED ORDER — FENTANYL CITRATE 0.05 MG/ML IJ SOLN
INTRAMUSCULAR | Status: AC
  Filled 2014-02-24: qty 5

## 2014-02-24 MED ORDER — METOCLOPRAMIDE HCL 5 MG/ML IJ SOLN
5.0000 mg | Freq: Three times a day (TID) | INTRAMUSCULAR | Status: DC | PRN
  Filled 2014-02-24: qty 2

## 2014-02-24 MED ORDER — METOCLOPRAMIDE HCL 5 MG PO TABS
5.0000 mg | ORAL_TABLET | Freq: Three times a day (TID) | ORAL | Status: DC | PRN
  Filled 2014-02-24: qty 2

## 2014-02-24 MED ORDER — ONDANSETRON HCL 4 MG/2ML IJ SOLN
4.0000 mg | Freq: Four times a day (QID) | INTRAMUSCULAR | Status: DC | PRN
Start: 2014-02-24 — End: 2014-02-26

## 2014-02-24 MED ORDER — PNEUMOCOCCAL VAC POLYVALENT 25 MCG/0.5ML IJ INJ
0.5000 mL | INJECTION | INTRAMUSCULAR | Status: DC
  Filled 2014-02-24: qty 0.5

## 2014-02-24 MED ORDER — FENTANYL CITRATE 0.05 MG/ML IJ SOLN
INTRAMUSCULAR | Status: DC | PRN
  Administered 2014-02-24: 100 ug via INTRAVENOUS

## 2014-02-24 MED ORDER — ENOXAPARIN SODIUM 40 MG/0.4ML ~~LOC~~ SOLN
40.0000 mg | SUBCUTANEOUS | Status: DC
Start: 2014-02-24 — End: 2014-02-25

## 2014-02-24 MED ORDER — PHENOL 1.4 % MT LIQD
1.0000 | OROMUCOSAL | Status: DC | PRN
Start: 2014-02-24 — End: 2014-02-26

## 2014-02-24 SURGICAL SUPPLY — 50 items
ADH SKN CLS APL DERMABOND .7 (GAUZE/BANDAGES/DRESSINGS) ×1
BLADE SAW SGTL 18X1.27X75 (BLADE) ×2 IMPLANT
BLADE SAW SGTL 18X1.27X75MM (BLADE) ×1
CAPT HIP FX BIPOLAR/UNIPOLAR ×2 IMPLANT
COVER SURGICAL LIGHT HANDLE (MISCELLANEOUS) ×3 IMPLANT
DERMABOND ADVANCED (GAUZE/BANDAGES/DRESSINGS) ×2
DERMABOND ADVANCED .7 DNX12 (GAUZE/BANDAGES/DRESSINGS) ×1 IMPLANT
DRAPE INCISE IOBAN 85X60 (DRAPES) ×5 IMPLANT
DRAPE ORTHO SPLIT 77X108 STRL (DRAPES) ×6
DRAPE SURG ORHT 6 SPLT 77X108 (DRAPES) ×2 IMPLANT
DRAPE U-SHAPE 47X51 STRL (DRAPES) ×3 IMPLANT
DRSG AQUACEL AG ADV 3.5X10 (GAUZE/BANDAGES/DRESSINGS) ×3 IMPLANT
DURAPREP 26ML APPLICATOR (WOUND CARE) ×3 IMPLANT
ELECT BLADE 4.0 EZ CLEAN MEGAD (MISCELLANEOUS) ×3
ELECT REM PT RETURN 9FT ADLT (ELECTROSURGICAL) ×3
ELECTRODE BLDE 4.0 EZ CLN MEGD (MISCELLANEOUS) ×1 IMPLANT
ELECTRODE REM PT RTRN 9FT ADLT (ELECTROSURGICAL) ×1 IMPLANT
EVACUATOR 1/8 PVC DRAIN (DRAIN) IMPLANT
FACESHIELD WRAPAROUND (MASK) ×6 IMPLANT
FACESHIELD WRAPAROUND OR TEAM (MASK) ×2 IMPLANT
GLOVE BIOGEL PI IND STRL 7.5 (GLOVE) ×1 IMPLANT
GLOVE BIOGEL PI IND STRL 8 (GLOVE) ×2 IMPLANT
GLOVE BIOGEL PI INDICATOR 7.5 (GLOVE) ×2
GLOVE BIOGEL PI INDICATOR 8 (GLOVE) ×4
GLOVE ECLIPSE 8.0 STRL XLNG CF (GLOVE) ×3 IMPLANT
GLOVE ORTHO TXT STRL SZ7.5 (GLOVE) ×3 IMPLANT
GLOVE SURG ORTHO 8.0 STRL STRW (GLOVE) ×3 IMPLANT
GOWN STRL REIN 3XL XLG LVL4 (GOWN DISPOSABLE) ×3 IMPLANT
GOWN STRL REUS W/ TWL LRG LVL3 (GOWN DISPOSABLE) ×3 IMPLANT
GOWN STRL REUS W/TWL LRG LVL3 (GOWN DISPOSABLE) ×9
HANDPIECE INTERPULSE COAX TIP (DISPOSABLE)
IMMOBILIZER KNEE 22 UNIV (SOFTGOODS) ×3 IMPLANT
KIT BASIN OR (CUSTOM PROCEDURE TRAY) ×3 IMPLANT
KIT ROOM TURNOVER OR (KITS) ×3 IMPLANT
MANIFOLD NEPTUNE II (INSTRUMENTS) ×3 IMPLANT
NS IRRIG 1000ML POUR BTL (IV SOLUTION) ×3 IMPLANT
PACK TOTAL JOINT (CUSTOM PROCEDURE TRAY) ×3 IMPLANT
PAD ARMBOARD 7.5X6 YLW CONV (MISCELLANEOUS) ×6 IMPLANT
SET HNDPC FAN SPRY TIP SCT (DISPOSABLE) IMPLANT
SPONGE LAP 4X18 X RAY DECT (DISPOSABLE) ×6 IMPLANT
STAPLER VISISTAT 35W (STAPLE) ×2 IMPLANT
SUT MNCRL AB 4-0 PS2 18 (SUTURE) IMPLANT
SUT VIC AB 1 CT1 27 (SUTURE) ×6
SUT VIC AB 1 CT1 27XBRD ANBCTR (SUTURE) ×2 IMPLANT
SUT VIC AB 2-0 CT1 27 (SUTURE)
SUT VIC AB 2-0 CT1 TAPERPNT 27 (SUTURE) IMPLANT
TOWEL OR 17X24 6PK STRL BLUE (TOWEL DISPOSABLE) ×3 IMPLANT
TOWEL OR 17X26 10 PK STRL BLUE (TOWEL DISPOSABLE) ×5 IMPLANT
TRAY FOLEY CATH 14FR (SET/KITS/TRAYS/PACK) IMPLANT
WATER STERILE IRR 1000ML POUR (IV SOLUTION) ×12 IMPLANT

## 2014-02-24 NOTE — Progress Notes (Signed)
Anesthesia called [facility] to feeling pt warranted overnight stay in SDU after surgery for closer monitoring. Pt did fine after surgery and is waking up OK, but has exhibited some apneic periods in PACU with changes in CO2 levels and placed on Venti mask. No other acute issues. Surgeon agrees with transfer. Suggested by Dr. Ivin Booty of anesthesia dept. Pt will move to 3S after PACU.  Jimmye Norman, NP [facility]

## 2014-02-24 NOTE — Progress Notes (Signed)
Note/chart reviewed.  Katie Chalee Hirota, RD, LDN Pager #: [phone] After-Hours Pager #: [phone]  

## 2014-02-24 NOTE — Progress Notes (Signed)
I spoke to Mr. Kirn' son and daughter on the phone today and had a lengthy discussion regarding the options available.  After weighing through the risks and benefits of each they felt that an operation was best for him for potential mobility, and pain relief. We felt that a hip hemiarthroplasty was best to reduce risk of revision surgery  Consent obtained from his daughter NPO today

## 2014-02-24 NOTE — Anesthesia Postprocedure Evaluation (Signed)
  Anesthesia Post-op Note  Patient: Reginald Stokes  Procedure(s) Performed: Procedure(s): ARTHROPLASTY BIPOLAR HIP (Right)  Patient Location: PACU  Anesthesia Type: Regional   Level of Consciousness: awake, mental status unchanged from pre op.  Airway and Oxygen Therapy: Patient Spontanous Breathing with FM O2 and CO2 monitoring.  Post-op Pain: mild  Post-op Assessment: Post-op Vital signs reviewed  Post-op Vital Signs: Reviewed  Last Vitals:  Filed Vitals:   02/24/14 2045  BP: 134/67  Pulse: 74  Temp:   Resp: 19    Complications: No apparent anesthesia complications but suggest monitoring on step down unit overnight.

## 2014-02-24 NOTE — Clinical Social Work Note (Signed)
Clinical Social Work Department BRIEF PSYCHOSOCIAL ASSESSMENT 02/24/2014  Patient:  Reginald Stokes, Reginald Stokes     Account Number:  [id number]     Admit date:  02/22/2014  Clinical Social Worker:  Cristy Folks  Date/Time:  02/24/2014 01:45 PM  Referred by:  Physician  Date Referred:  02/24/2014  Other Referral:   N/A   Interview type:  Family Other interview type:    PSYCHOSOCIAL DATA Living Status:  FACILITY Admitted from facility:  [facility] Level of care:  Long Term Acute Primary support name:  Reginald Stokes [phone]  Primary support relationship to patient:  CHILD, ADULT Degree of support available:   Good Support    CURRENT CONCERNS  Other Concerns:   N/A    SOCIAL WORK ASSESSMENT / PLAN Pt admitted from [facility] on 02/22/14 where he had a fall. Pt is a [facility] participant. SW spoke with pt.'s daughter, Reginald Stokes ([phone]) who resides in [place], who reported that they would like for pt to return to [facility]. SW spoke with Eugenie Filler of [facility] [phone] and Sonny Dandy who reported that pt can return to SNF. Pt scheduled to have surgery today ot tomorrow. This information shared with pt.'s daughter.   Assessment/plan status:  N/A Other assessment/ plan:  N/A Information/referral to community resources:  N/A  PATIENT'S/FAMILY'S RESPONSE TO PLAN OF CARE: SW role explained to pt.'s family, family voiced understanding. SW will continue to follow.    Derrell Lolling, MSW Clinical Social Worker [phone]

## 2014-02-24 NOTE — Op Note (Signed)
NAME:  Reginald Stokes, Reginald Stokes                ACCOUNT NO.:  [id number]   MEDICAL RECORD NO.: [id number]   LOCATION:  1435                         FACILITY:  [facility]   DATE OF BIRTH:  [birth year]  PHYSICIAN:  Madlyn Frankel. Charlann Boxer, M.D.     DATE OF PROCEDURE:  02/24/14                               OPERATIVE REPORT     PREOPERATIVE DIAGNOSIS:  Right  displaced femoral neck fracture.   POSTOPERATIVE DIAGNOSIS:  Right  displaced femoral neck fracture.   PROCEDURE:  Right  hip hemiarthroplasty utilizing DePuy component, size 5 standard [employer] Basic stem with a 50mm unipolar ball with a +0 adapter.   SURGEON:  Madlyn Frankel. Charlann Boxer, MD   ASSISTANT:  Surgical team   ANESTHESIA:  General.   SPECIMENS:  None.   DRAINS:  None.   BLOOD LOSS:  About 50 cc.   COMPLICATIONS:  None.   INDICATION OF PROCEDURE:  Reginald Stokes is a 80 year old male with multiple medical co-morbidities including dementia who resides in a skilled environment.  He unfortunately had a fall at the facility.  He was admitted to the hospital after radiographs revealed a femoral neck fracture.  He was seen and evaluated and was scheduled for surgery for Fixation after lengthy discussion with family about pros and cons of performing an operation on their family member in his condition. The necessity of surgical repair was discussed with she and her family.  Consent was obtained after reviewing risks of infection, DVT, component failure, and need for revision surgery.   PROCEDURE IN DETAIL:  The patient was brought to the operative theater. Once adequate anesthesia, preoperative antibiotics, 2 g of Ancef administered, the patient was positioned into the left lateral decubitus position with the right side up.  The right lower extremity was then prepped and draped in sterile fashion.  A time-out was performed identifying the patient, planned procedure, and extremity.   A lateral incision was made off the proximal trochanter.  Sharp dissection was carried down to the iliotibial band and gluteal fascia. The gluteal fascia was then incised for posterior approach.  The short external rotators were taken down separate from the posterior capsule. An L capsulotomy was made preserving the posterior leaflet for later anatomic repair. Fracture site was identified and after removing comminuted segments of the posterior femoral neck, the femoral head was removed without difficulty and measured on the back table  using the sizing rings and determined to be 50 mm in diameter.   The proximal femur was then exposed.  Retractors placed.  I then drilled, opened the proximal femur.  Then I hand reamed once and  Irrigated the canal to try to prevent fat emboli.  I began broaching the femur with a starting size 1 broach up to a size 5 broach with good medial and lateral metaphyseal fit without evidence of any torsion or movement.  A trial reduction was carried out with a standard neck and a +0 adapter with a 50mm ball.  The hip reduced nicely.  The leg lengths appeared to be equal compared to the down leg.   The hip went through a range of motion without evidence of any  subluxation or impingement.   Given these findings, the trial components removed.  The final 5 standard [employer] Basic stem was opened.  After irrigating the canal, the final stem was impacted and sat at the level where the broach was. Based on this and the trial reduction, a +0 adapter was opened and impacted in the 50mm unipolar ball onto a clean and dry trunnion.  The hip had been irrigated throughout the case and again at this point.  I re- Approximated the posterior capsule to the superior leaflet using a  #1 Vicryl,  and placed a medium Hemovac drain deep.  The remainder of the wound was closed with #1 Vicryl in the iliotibial band and gluteal fascia, a  2-0 Vicryl in the sub-Q tissue and a running 4-0 Monocryl in the skin.  The hip was cleaned,  dried, and dressed sterilely using Dermabond and Aquacel dressing.  Drain site was dressed separately.  She was then brought to recovery room, extubated in stable condition, tolerating the procedure well.  Reginald Gins, PA-C was present and utilized as Geophysicist/field seismologist for the entire case from  Preoperative positioning to management of the contralateral extremity and retractors to  General facilitation of the procedure.  He was also involved with primary wound closure.         Madlyn Frankel Charlann Boxer, M.D.

## 2014-02-24 NOTE — Progress Notes (Signed)
SLP Cancellation Note  Patient Details Name: Reginald Stokes MRN: [id number] DOB: 14-Jul-1933   Cancelled treatment:       Reason Eval/Treat Not Completed: Medical issues which prohibited therapy. Pt remains NPO pending procedure, to be done later this evening per RN. Will f/u tomorrow for bedside swallow evaluation.    Maxcine Ham, M.A. CCC-SLP [phone]  Maxcine Ham 02/24/2014, 1:34 PM

## 2014-02-24 NOTE — Progress Notes (Signed)
Patient ID: Reginald Stokes, male   DOB: 04/17/1934, 80 y.o.   MRN: [id number]  Will be reaching out to family today to come up with plan As soon as I know something one way or another I will make team aware  Maintain NPO status for now  

## 2014-02-24 NOTE — Anesthesia Procedure Notes (Signed)
Procedure Name: Intubation Date/Time: 02/24/2014 6:30 PM Performed by: Armandina Gemma Pre-anesthesia Checklist: Patient identified, Patient being monitored, Timeout performed, Emergency Drugs available and Suction available Patient Re-evaluated:Patient Re-evaluated prior to inductionOxygen Delivery Method: Circle system utilized Preoxygenation: Pre-oxygenation with 100% oxygen Intubation Type: IV induction Ventilation: Mask ventilation without difficulty Laryngoscope Size: Miller and 2 Grade View: Grade I Tube type: Oral Number of attempts: 1 Airway Equipment and Method: Stylet and Video-laryngoscopy Placement Confirmation: breath sounds checked- equal and bilateral,  ETT inserted through vocal cords under direct vision and positive ETCO2 Secured at: 23 cm Tube secured with: Tape Dental Injury: Teeth and Oropharynx as per pre-operative assessment  Comments: IV induction Crews- SUX- intubation AM CRNA- poor view with laryngoscopy- green secretions at back of throat- epiglottis red slightly swollen- anterior view- poor neck and mouth mobility even with SUX- Glidescope- view of cords- intubated- mouth and teeth as preop - bilat BS Crews- CONSIDER GLIDESCOPE FOR INTUBATION

## 2014-02-24 NOTE — Interval H&P Note (Signed)
History and Physical Interval Note:  02/24/2014 6:35 PM  Reginald Stokes  has presented today for surgery, with the diagnosis of right femoral neck fracture  The various methods of treatment have been discussed with the patient and family. After consideration of risks, benefits and other options for treatment, the patient has consented to  Procedure(s): ARTHROPLASTY BIPOLAR HIP (Right) as a surgical intervention .  The patient's history has been reviewed, patient examined, no change in status, stable for surgery.  I have reviewed the patient's chart and labs.  Questions were answered to the patient's satisfaction.     Shelda Pal

## 2014-02-24 NOTE — Transfer of Care (Signed)
Immediate Anesthesia Transfer of Care Note  Patient: Reginald Stokes  Procedure(s) Performed: Procedure(s): ARTHROPLASTY BIPOLAR HIP (Right)  Patient Location: PACU  Anesthesia Type:General  Level of Consciousness: sedated  Airway & Oxygen Therapy: Patient Spontanous Breathing and Patient connected to face mask oxygen  Post-op Assessment: Report given to PACU RN and Post -op Vital signs reviewed and stable  Post vital signs: Reviewed and stable  Complications: No apparent anesthesia complications

## 2014-02-24 NOTE — Progress Notes (Signed)
[facility] PROGRESS NOTE  Reginald Stokes ZOX:[id number] DOB: March 11, 1934 DOA: 02/22/2014 PCP: Thane Edu, MD  Assessment/Plan: 80 year old with a history of HTN, CKD, advanced dementia, Parkinson percent after falling at [facility] nursing home. Patient was brought to the emergency department and imaging was conducted showing a right subcapital femoral neck fracture.   1. Right femoral neck fracture  -Orthopedics consulted and appreciated  -Pending decision regarding surgery  -Currently n.p.o., continue IV fluids, pain medications as needed  -Given patient's age as well as comorbidities, patient is moderate risk   2. Coronary artery disease status post CABG in 2002  -EKG shows left ventricular hypertrophy with repolarization abnormalities  3. Parkinson's disease; Continue Sinemet  4. Advanced dementia; Continue Aricept as well as lorazepam as needed  5. Hypertension; Continue lisinopril  6. Thrombocytopenia  -Appears to be chronically low, we'll continue to monitor CBC  7. Hypernatremia likely due to free water deficit  -Continue IVF, recheck  8. Severe malnutrition in the context of advanced dementia; cont supportive care     Code Status: DNR Family Communication: d/w patient, called Stokes,Reginald [phone]; ortho plan d/w with family  -called updated [phone]  (indicate person spoken with, relationship, and if by phone, the number) Disposition Plan: pend clinical improvement    Consultants:  Ortho   Procedures:  none  Antibiotics:  none (indicate start date, and stop date if known)  HPI/Subjective: confused  Objective: Filed Vitals:   02/24/14 0600  BP: 157/69  Pulse: 68  Temp: 97.7 F (36.5 C)  Resp: 20    Intake/Output Summary (Last 24 hours) at 02/24/14 1027 Last data filed at 02/24/14 0900  Gross per 24 hour  Intake 1170.83 ml  Output    800 ml  Net 370.83 ml   There were no vitals filed for this  visit.  Exam:   General:  confused  Cardiovascular: s1,s2 rrr  Respiratory: CTA BL  Abdomen: soft, nt, nd   Musculoskeletal: no pedal edema   Data Reviewed: Basic Metabolic Panel:  Recent Labs Lab 02/22/14 1250 02/23/14 0517 02/24/14 0530  NA 151* 149* 153*  K 4.5 3.9 4.0  CL 112 112 116*  CO2 GLUCOSE 110* 92 86  BUN 25* 24* 29*  CREATININE 1.32 1.34 1.55*  CALCIUM 8.9 8.7 8.4   Liver Function Tests:  Recent Labs Lab 02/22/14 1250  AST 22  ALT 7  ALKPHOS 85  BILITOT 0.9  PROT 6.6  ALBUMIN 3.5   No results found for this basename: LIPASE, AMYLASE,  in the last 168 hours No results found for this basename: AMMONIA,  in the last 168 hours CBC:  Recent Labs Lab 02/22/14 1250 02/23/14 0517 02/24/14 0530  WBC 4.9 4.5 5.5  HGB 13.8 12.5* 13.0  HCT 42.6 39.2 39.8  MCV 83.4 86.0 84.5  PLT 129* 116* 101*   Cardiac Enzymes: No results found for this basename: CKTOTAL, CKMB, CKMBINDEX, TROPONINI,  in the last 168 hours BNP (last 3 results) No results found for this basename: PROBNP,  in the last 8760 hours CBG: No results found for this basename: GLUCAP,  in the last 168 hours  Recent Results (from the past 240 hour(s))  MRSA PCR SCREENING     Status: None   Collection Time    02/22/14  8:43 PM      Result Value Ref Range Status   MRSA by PCR NEGATIVE  NEGATIVE Final   Comment:  The GeneXpert MRSA Assay (FDA     approved for NASAL specimens     only), is one component of a     comprehensive MRSA colonization     surveillance program. It is not     intended to diagnose MRSA     infection nor to guide or     monitor treatment for     MRSA infections.     Studies: Dg Pelvis 1-2 Views  02/22/2014   CLINICAL DATA:  Fall.  Right leg deformity.  EXAM: PELVIS - 1-2 VIEW  COMPARISON:  None.  FINDINGS: Frontal view of the pelvis shows diffuse bony demineralization. SI joints and symphysis pubis are within normal limits. Left femoral  neck is intact. There is a fracture of the right femoral neck with a degree of impaction. Fracture line is at the subcapital position.  IMPRESSION: Subcapital right femoral neck fracture.   Electronically Signed   By: Kennith Center M.D.   On: 02/22/2014 11:42   Dg Femur Right  02/22/2014   CLINICAL DATA:  Fall with right leg deformity.  EXAM: RIGHT FEMUR - 2 VIEW  COMPARISON:  None.  FINDINGS: Two view exam of the right femur shows diffuse bony demineralization. Subcapital right femoral neck fracture is evident. No other fracture within the right femur is visible.  IMPRESSION: Subcapital right femoral neck fracture.   Electronically Signed   By: Kennith Center M.D.   On: 02/22/2014 11:43    Scheduled Meds: . allopurinol  200 mg Oral Daily  . aspirin  81 mg Oral Daily  . carbidopa-levodopa  2 tablet Oral TID  . colchicine  0.6 mg Oral Daily  . donepezil  10 mg Oral QHS  . lisinopril  20 mg Oral Daily  . multivitamin  1 tablet Oral Daily   Continuous Infusions: . dextrose 5 % and 0.45% NaCl 100 mL/hr at 02/24/14 0981    Principal Problem:   Femoral neck fracture Active Problems:   Parkinson's disease   S/P CABG (coronary artery bypass graft)   Dementia   Hip fracture   Protein-calorie malnutrition, severe    Time spent: >35 minutes     Esperanza Sheets  [facility] Pager [phone]. If 7PM-7AM, please contact night-coverage at [email or web address], password [facility] 02/24/2014, 10:27 AM  LOS: 2 days

## 2014-02-24 NOTE — H&P (View-Only) (Signed)
Patient ID: Reginald Stokes, male   DOB: May 21, 1934, 80 y.o.   MRN: [id number]  Will be reaching out to family today to come up with plan As soon as I know something one way or another I will make team aware  Maintain NPO status for now

## 2014-02-24 NOTE — Anesthesia Preprocedure Evaluation (Addendum)
Anesthesia Evaluation  Patient identified by MRN, date of birth, ID band Patient awake    Reviewed: Allergy & Precautions, H&P , NPO status   Airway Mallampati: II      Dental  (+) Dental Advisory Given   Pulmonary former smoker,          Cardiovascular + angina + CAD and +CHF     Neuro/Psych    GI/Hepatic   Endo/Other  Hyperthyroidism   Renal/GU      Musculoskeletal   Abdominal   Peds  Hematology   Anesthesia Other Findings   Reproductive/Obstetrics                      Anesthesia Physical Anesthesia Plan  ASA: III  Anesthesia Plan: Regional   Post-op Pain Management:    Induction: Intravenous  Airway Management Planned: Oral ETT  Additional Equipment:   Intra-op Plan:   Post-operative Plan: Possible Post-op intubation/ventilation  Informed Consent:   Plan Discussed with: CRNA and Anesthesiologist  Anesthesia Plan Comments:         Anesthesia Quick Evaluation

## 2014-02-25 ENCOUNTER — Inpatient Hospital Stay (HOSPITAL_COMMUNITY): Payer: Medicare (Managed Care)

## 2014-02-25 ENCOUNTER — Encounter (HOSPITAL_COMMUNITY): Payer: Self-pay | Admitting: Orthopedic Surgery

## 2014-02-25 LAB — BASIC METABOLIC PANEL
ANION GAP: 13 (ref 5–15)
BUN: 29 mg/dL — AB (ref 6–23)
CALCIUM: 8.2 mg/dL — AB (ref 8.4–10.5)
CHLORIDE: 113 meq/L — AB (ref 96–112)
CO2: 25 mEq/L (ref 19–32)
CREATININE: 1.28 mg/dL (ref 0.50–1.35)
GFR calc Af Amer: 60 mL/min — ABNORMAL LOW (ref >90)
GFR calc non Af Amer: 52 mL/min — ABNORMAL LOW (ref >90)
Glucose, Bld: 106 mg/dL — ABNORMAL HIGH (ref 70–99)
Potassium: 4.1 mEq/L (ref 3.7–5.3)
Sodium: 151 mEq/L — ABNORMAL HIGH (ref 137–147)

## 2014-02-25 MED ORDER — POLYETHYLENE GLYCOL 3350 17 G PO PACK
17.0000 g | PACK | Freq: Every day | ORAL | Status: DC
  Filled 2014-02-25 (×2): qty 1

## 2014-02-25 MED ORDER — TRAMADOL-ACETAMINOPHEN 37.5-325 MG PO TABS: 1.0000 | ORAL_TABLET | Freq: Four times a day (QID) | ORAL | Status: AC | PRN

## 2014-02-25 MED ORDER — SENNOSIDES-DOCUSATE SODIUM 8.6-50 MG PO TABS
1.0000 | ORAL_TABLET | Freq: Two times a day (BID) | ORAL | Status: DC
  Filled 2014-02-25 (×2): qty 1

## 2014-02-25 MED ORDER — ENOXAPARIN SODIUM 40 MG/0.4ML ~~LOC~~ SOLN: 40.0000 mg | SUBCUTANEOUS | Status: AC

## 2014-02-25 MED ORDER — SODIUM CHLORIDE 0.9 % IV BOLUS (SEPSIS)
250.0000 mL | Freq: Once | INTRAVENOUS | Status: AC
  Administered 2014-02-25: 250 mL via INTRAVENOUS

## 2014-02-25 NOTE — Evaluation (Signed)
Clinical/Bedside Swallow Evaluation Patient Details  Name: Reginald Stokes MRN: [id number] Date of Birth: 08-25-1933  Today's Date: 02/25/2014 Time: 0906-0921 SLP Time Calculation (min): 15 min  Past Medical History:  Past Medical History  Diagnosis Date  . Parkinson disease   . Dementia   . Hyperthyroidism   . Depression   . Gout   . CAD (coronary artery disease)   . CHF (congestive heart failure)   . Stable angina    Past Surgical History:  Past Surgical History  Procedure Laterality Date  . Coronary artery bypass graft     HPI:  80 year old with a history of HTN, CKD, advanced dementia, Parkinson admitted after falling at [facility] nursing home. Patient was brought to the emergency department and imaging was conducted showing a right subcapital femoral neck fracture. S/p right hip hemiarthroplasty 9/2. Note documentation of a cough with some apneic episodes post op. CXR pending.    Assessment / Plan / Recommendation Clinical Impression  Bedside swallow evaluation complete. Cognitively, patient unsafe for pos at this time; suspect baseline dementia exacerbated by acute injury and recent surgery. With maximum tactile and verbal cueing provided by SLP, patient orally accepted minimal ice chip trials however with significantly delayed A-P transit of bolus, periods of oral holding and suspected delays in swallow initiation. No attempts to orally accept trials of pureed solids. Aspiration risk high at this time, particularly in light of h/o dementia and Parkinson's disease. SLP will f/u at bedside. Suspect that as mentation improves, we will see concomitant improvements in swallowing function.     Aspiration Risk  Severe    Diet Recommendation NPO   Medication Administration: Via alternative means    Other  Recommendations Oral Care Recommendations:  (QID)   Follow Up Recommendations   (tbd)    Frequency and Duration min 2x/week  2 weeks   Pertinent Vitals/Pain n/a      Swallow Study    General HPI: 80 year old with a history of HTN, CKD, advanced dementia, Parkinson admitted after falling at [facility] nursing home. Patient was brought to the emergency department and imaging was conducted showing a right subcapital femoral neck fracture. S/p right hip hemiarthroplasty 9/2. Note documentation of a cough with some apneic episodes post op. CXR pending.  Type of Study: Bedside swallow evaluation Previous Swallow Assessment: none noted in chart Diet Prior to this Study: NPO Temperature Spikes Noted: No Respiratory Status: Nasal cannula History of Recent Intubation: Yes Length of Intubations (days):  (surgery only) Date extubated: 02/24/14 Behavior/Cognition: Confused;Lethargic;Doesn't follow directions;Requires cueing Oral Cavity - Dentition: Adequate natural dentition (difficult to assess (appear to be natural teeth?)) Self-Feeding Abilities: Total assist Patient Positioning: Upright in bed Baseline Vocal Quality: Clear Volitional Cough: Cognitively unable to elicit Volitional Swallow: Unable to elicit    Oral/Motor/Sensory Function Overall Oral Motor/Sensory Function: Appears within functional limits for tasks assessed (although unable to formally assess due to mentation)   Ice Chips Ice chips: Impaired Presentation: Spoon Oral Phase Impairments: Impaired anterior to posterior transit;Poor awareness of bolus Oral Phase Functional Implications: Prolonged oral transit;Oral holding Pharyngeal Phase Impairments: Suspected delayed Swallow;Wet Vocal Quality   Thin Liquid Thin Liquid: Not tested    Nectar Thick Nectar Thick Liquid: Not tested   Honey Thick Honey Thick Liquid: Not tested   Puree Puree: Impaired Presentation: Spoon Oral Phase Impairments: Poor awareness of bolus (no oral acceptance of bolus)   Solid   GO   Iridian Reader MA, CCC-SLP [phone]  Solid: Not tested  Lazette Estala Meryl 02/25/2014,9:50 AM

## 2014-02-25 NOTE — Care Management Note (Signed)
    Page 1 of 1   02/25/2014     12:05:59 PM CARE MANAGEMENT NOTE 02/25/2014  Patient:  Reginald Stokes, Reginald Stokes   Account Number:  [id number]  Date Initiated:  02/25/2014  Documentation initiated by:  Donn Pierini  Subjective/Objective Assessment:   Pt admitted s/p fall with femoral fx- now s/p repair     Action/Plan:   PTA pt at at [facility]- CSW consulted   Anticipated DC Date:  02/27/2014   Anticipated DC Plan:  SKILLED NURSING FACILITY  In-house referral  Clinical Social Worker      DC Planning Services  CM consult      Choice offered to / List presented to:             Status of service:  In process, will continue to follow Medicare Important Message given?   (If response is "NO", the following Medicare IM given date fields will be blank) Date Medicare IM given:   Medicare IM given by:   Date Additional Medicare IM given:   Additional Medicare IM given by:    Discharge Disposition:    Per UR Regulation:  Reviewed for med. necessity/level of care/duration of stay  If discussed at Long Length of Stay Meetings, dates discussed:    Comments:

## 2014-02-25 NOTE — Evaluation (Signed)
Occupational Therapy Evaluation Patient Details Name: Reginald Stokes MRN: [id number] DOB: 22-Nov-1933 Today's Date: 02/25/2014    History of Present Illness Pt admitted after falling at SNF resulting in R femoral neck fx.  Now s/p R hip hemiarthroplasty.  Pt with hx of Parkinsons and advanced dementia. At baseline, he does not verbalize and walks poorly.   Clinical Impression   Pt is a resident of [facility].  He was likely dependent at baseline in self care. Pt kept his eyes closed throughout the session and communicated by grimacing and yelling out with movement. No acute OT needs identified.  Recommend up to chair with nursing.   Follow Up Recommendations  SNF;Supervision/Assistance - 24 hour    Equipment Recommendations       Recommendations for Other Services       Precautions / Restrictions Precautions Precautions: Posterior Hip;Fall Precaution Booklet Issued: No Precaution Comments: wrote precautions on dry erase board Restrictions Weight Bearing Restrictions: No (WBAT)      Mobility Bed Mobility Overal bed mobility: +2 for physical assistance;Needs Assistance Bed Mobility: Supine to Sit     Supine to sit: +2 for physical assistance;Total assist        Transfers Overall transfer level: Needs assistance   Transfers: Stand Pivot Transfers   Stand pivot transfers: Total assist            Balance Overall balance assessment: Needs assistance Sitting-balance support: Feet supported;Bilateral upper extremity supported Sitting balance-Leahy Scale: Zero   Postural control: Posterior lean (strong)                                  ADL                                         General ADL Comments: Total assist for all ADL, currently NPO.     Vision                 Additional Comments: did not open eyes   Perception     Praxis      Pertinent Vitals/Pain Pain Assessment: Faces Faces Pain Scale: Hurts whole  lot Pain Location: R hip Pain Intervention(s): Repositioned;Monitored during session     Hand Dominance     Extremity/Trunk Assessment Upper Extremity Assessment Upper Extremity Assessment: Difficult to assess due to impaired cognition   Lower Extremity Assessment Lower Extremity Assessment: Difficult to assess due to impaired cognition       Communication Communication Communication: Expressive difficulties;Receptive difficulties   Cognition Arousal/Alertness: Lethargic (pt was not premedicated) Behavior During Therapy: Flat affect (kept eyes closed throughout) Overall Cognitive Status: History of cognitive impairments - at baseline                     General Comments       Exercises       Shoulder Instructions      Home Living Family/patient expects to be discharged to:: Skilled nursing facility                                 Additional Comments: Pt is a resident of [place].      Prior Functioning/Environment Level of Independence: Needs assistance        Comments: No  family available, likely dependent at baseline in mobility and ADL.    OT Diagnosis:     OT Problem List:     OT Treatment/Interventions:      OT Goals(Current goals can be found in the care plan section)    OT Frequency:     Barriers to D/C:            Co-evaluation PT/OT/SLP Co-Evaluation/Treatment: Yes Reason for Co-Treatment: For patient/therapist safety;Necessary to address cognition/behavior during functional activity   OT goals addressed during session: ADL's and self-care      End of Session Equipment Utilized During Treatment: Gait belt Nurse Communication: Mobility status;Precautions (ok for no chair alarm)  Activity Tolerance: Patient limited by fatigue;Patient limited by pain Patient left: in chair;with call bell/phone within reach   Time: 1156-1210 OT Time Calculation (min): 14 min Charges:  OT General Charges $OT Visit: 1  Procedure OT Evaluation $Initial OT Evaluation Tier I: 1 Procedure G-Codes:    Reginald Stokes 02/25/2014, 12:28 PM [phone]

## 2014-02-25 NOTE — Progress Notes (Signed)
Orthopedic Tech Progress Note Patient Details:  Reginald Stokes 08/30/1933 [id number]  Ortho Devices Ortho Device/Splint Location: trapeze bar patient helper Ortho Device/Splint Interventions: Application   Nikki Dom 02/25/2014, 1:09 PM

## 2014-02-25 NOTE — Evaluation (Signed)
Physical Therapy Evaluation Patient Details Name: Reginald Stokes MRN: [id number] DOB: August 29, 1933 Today's Date: 02/25/2014   History of Present Illness  Pt admitted after falling at SNF resulting in R femoral neck fx.  Now s/p R hip hemiarthroplasty.  Pt with hx of Parkinsons and advanced dementia. At baseline, he does not verbalize and walks poorly.  Clinical Impression  Patient demonstrates deficits in functional mobility as indicated below. Will benefit from trial of skilled PT to address deficits and maximize function. Will see as indicated and progress as tolerated.    Follow Up Recommendations SNF;Supervision/Assistance - 24 hour    Equipment Recommendations  None recommended by PT    Recommendations for Other Services       Precautions / Restrictions Precautions Precautions: Posterior Hip;Fall Precaution Booklet Issued: No Precaution Comments: wrote precautions on dry erase board Restrictions Weight Bearing Restrictions: No (WBAT)      Mobility  Bed Mobility Overal bed mobility: +2 for physical assistance;Needs Assistance Bed Mobility: Supine to Sit     Supine to sit: +2 for physical assistance;Total assist        Transfers Overall transfer level: Needs assistance   Transfers: Stand Pivot Transfers   Stand pivot transfers: Total assist          Ambulation/Gait                Stairs            Wheelchair Mobility    Modified Rankin (Stroke Patients Only)       Balance Overall balance assessment: Needs assistance Sitting-balance support: Feet supported Sitting balance-Leahy Scale: Zero   Postural control: Posterior lean                                   Pertinent Vitals/Pain Pain Assessment: Faces Faces Pain Scale: Hurts whole lot Pain Location: R hip Pain Intervention(s): Repositioned;Monitored during session    Home Living Family/patient expects to be discharged to:: Skilled nursing facility                  Additional Comments: Pt is a resident of [place].    Prior Function Level of Independence: Needs assistance         Comments: No family available, likely dependent at baseline in mobility and ADL.     Hand Dominance        Extremity/Trunk Assessment   Upper Extremity Assessment: Difficult to assess due to impaired cognition           Lower Extremity Assessment: Difficult to assess due to impaired cognition         Communication   Communication: Expressive difficulties;Receptive difficulties  Cognition Arousal/Alertness: Lethargic (pt was not premedicated) Behavior During Therapy: Flat affect (kept eyes closed throughout) Overall Cognitive Status: History of cognitive impairments - at baseline                      General Comments      Exercises        Assessment/Plan    PT Assessment Patient needs continued PT services  PT Diagnosis Difficulty walking;Abnormality of gait;Generalized weakness;Acute pain;Altered mental status   PT Problem List Decreased strength;Decreased range of motion;Decreased activity tolerance;Decreased balance;Decreased mobility;Decreased cognition;Pain  PT Treatment Interventions DME instruction;Gait training;Functional mobility training;Therapeutic activities;Therapeutic exercise;Balance training;Patient/family education   PT Goals (Current goals can be found in the Care Plan section) Acute Rehab PT Goals  Patient Stated Goal: none stated PT Goal Formulation: Patient unable to participate in goal setting Time For Goal Achievement: 03/11/14 Potential to Achieve Goals: Poor    Frequency Min 2X/week   Barriers to discharge        Co-evaluation PT/OT/SLP Co-Evaluation/Treatment: Yes Reason for Co-Treatment: Complexity of the patient's impairments (multi-system involvement);For patient/therapist safety PT goals addressed during session: Mobility/safety with mobility OT goals addressed during session: ADL's and  self-care       End of Session Equipment Utilized During Treatment: Gait belt;Oxygen Activity Tolerance: Patient limited by pain Patient left: in chair;with call bell/phone within reach Nurse Communication: Mobility status;Precautions         Time: 4098-1191 PT Time Calculation (min): 15 min   Charges:   PT Evaluation $Initial PT Evaluation Tier I: 1 Procedure PT Treatments $Therapeutic Activity: 8-22 mins   PT G CodesFabio Asa 02/25/2014, 3:46 PM Charlotte Crumb, PT DPT  [phone]

## 2014-02-25 NOTE — Progress Notes (Signed)
Report given to [facility], RN on [place]. Pt VSS, pt appears comfortable, all due meds given. Central telemetry notified of transfer and pt removed from monitor. Pt transferred to 5N20 via bed on 2LO2.

## 2014-02-25 NOTE — Clinical Social Work Note (Signed)
FL2 updated and to be placed on chart for MD signature- awaiting callback from [facility] Rep- CSW will folllow for dc back to [facility] once stable.  Reece Levy, MSW, Theresia Majors [phone]

## 2014-02-25 NOTE — Progress Notes (Signed)
[facility] PROGRESS NOTE  Reginald Stokes UJW:[id number] DOB: 27-Dec-1933 DOA: 02/22/2014 PCP: Reginald Edu, MD  Assessment/Plan: 80 year old with a history of HTN, CKD, advanced dementia, Parkinson percent after falling at [facility] nursing home. Patient was brought to the emergency department and imaging was conducted showing a right subcapital femoral neck fracture.   1. Right femoral neck fracture  -9/2: s/p Right hip hemiarthroplasty; per ortho   2. Coronary artery disease status post CABG in 2002  -EKG shows left ventricular hypertrophy with repolarization abnormalities  3. Parkinson's disease; Continue Sinemet  4. Advanced dementia; Continue Aricept as well as lorazepam as needed  5. Hypertension; Continue lisinopril  6. Thrombocytopenia  -Appears to be chronically low, we'll continue to monitor CBC  7. Hypernatremia likely due to free water deficit  -Continue IVF, recheck  8. Severe malnutrition in the context of advanced dementia; cont supportive care  9. Cough, some apneic episodes post op; obtain CXR; cont incentive spiro   TF to RNF    Prognosis remains poor due to advanced dementia, Pt does not communicate, ambulate well at baseline; poor nutrition, may not participate well in rehab due to rogressive dementia; remains at risk for recurrent infections, fall;  Pt is DNR   Code Status: DNR Family Communication: d/w patient, called Reginald Stokes [phone]; ortho plan d/w with family  -called updated [phone] recently   (indicate person spoken with, relationship, and if by phone, the number) Disposition Plan: pend clinical improvement    Consultants:  Ortho   Procedures:  none  Antibiotics:  none (indicate start date, and stop date if known)  HPI/Subjective: confused  Objective: Filed Vitals:   02/25/14 0700  BP:   Pulse:   Temp: 99.6 F (37.6 C)  Resp:     Intake/Output Summary (Last 24 hours) at 02/25/14 0804 Last data filed  at 02/25/14 0700  Gross per 24 hour  Intake   1750 ml  Output   1175 ml  Net    575 ml   There were no vitals filed for this visit.  Exam:   General:  confused  Cardiovascular: s1,s2 rrr  Respiratory: CTA BL  Abdomen: soft, nt, nd   Musculoskeletal: no pedal edema   Data Reviewed: Basic Metabolic Panel:  Recent Labs Lab 02/22/14 1250 02/23/14 0517 02/24/14 0530 02/25/14 0324  NA 151* 149* 153* 151*  K 4.5 3.9 4.0 4.1  CL 112 112 116* 113*  CO2 GLUCOSE 110* 92 86 106*  BUN 25* 24* 29* 29*  CREATININE 1.32 1.34 1.55* 1.28  CALCIUM 8.9 8.7 8.4 8.2*   Liver Function Tests:  Recent Labs Lab 02/22/14 1250  AST 22  ALT 7  ALKPHOS 85  BILITOT 0.9  PROT 6.6  ALBUMIN 3.5   No results found for this basename: LIPASE, AMYLASE,  in the last 168 hours No results found for this basename: AMMONIA,  in the last 168 hours CBC:  Recent Labs Lab 02/22/14 1250 02/23/14 0517 02/24/14 0530  WBC 4.9 4.5 5.5  HGB 13.8 12.5* 13.0  HCT 42.6 39.2 39.8  MCV 83.4 86.0 84.5  PLT 129* 116* 101*   Cardiac Enzymes: No results found for this basename: CKTOTAL, CKMB, CKMBINDEX, TROPONINI,  in the last 168 hours BNP (last 3 results) No results found for this basename: PROBNP,  in the last 8760 hours CBG: No results found for this basename: GLUCAP,  in the last 168 hours  Recent Results (from the past 240 hour(s))  MRSA  PCR SCREENING     Status: None   Collection Time    02/22/14  8:43 PM      Result Value Ref Range Status   MRSA by PCR NEGATIVE  NEGATIVE Final   Comment:            The GeneXpert MRSA Assay (FDA     approved for NASAL specimens     only), is one component of a     comprehensive MRSA colonization     surveillance program. It is not     intended to diagnose MRSA     infection nor to guide or     monitor treatment for     MRSA infections.     Studies: Dg Pelvis Portable  02/24/2014   CLINICAL DATA:  Right hip replacement.  EXAM:  PORTABLE PELVIS 1-2 VIEWS  COMPARISON:  02/22/2014.  FINDINGS: Patient status post right hip replacement with good anatomic alignment on AP view. No acute bony abnormality. Peripheral vascular disease.  IMPRESSION: 1.  Right hip replacement with good anatomic alignment on AP view .  2. Peripheral vascular disease.   Electronically Signed   By: Maisie Fus  Register   On: 02/24/2014 20:51    Scheduled Meds: . allopurinol  200 mg Oral Daily  . aspirin  81 mg Oral Daily  . carbidopa-levodopa  2 tablet Oral TID  . colchicine  0.6 mg Oral Daily  . docusate sodium  100 mg Oral BID  . donepezil  10 mg Oral QHS  . enoxaparin (LOVENOX) injection  30 mg Subcutaneous Q24H  . lisinopril  20 mg Oral Daily  . multivitamin  1 tablet Oral Daily  . pneumococcal 23 valent vaccine  0.5 mL Intramuscular Tomorrow-1000   Continuous Infusions: . sodium chloride 50 mL/hr at 02/24/14 2154  . dextrose 5 % and 0.45% NaCl 100 mL/hr at 02/24/14 1610    Principal Problem:   Femoral neck fracture Active Problems:   Parkinson's disease   S/P CABG (coronary artery bypass graft)   Dementia   Hip fracture   Protein-calorie malnutrition, severe    Time spent: >35 minutes     Esperanza Sheets  [facility] Pager [phone]. If 7PM-7AM, please contact night-coverage at [email or web address], password [facility] 02/25/2014, 8:04 AM  LOS: 3 days

## 2014-02-26 DIAGNOSIS — Z515 Encounter for palliative care: Secondary | ICD-10-CM

## 2014-02-26 DIAGNOSIS — R1310 Dysphagia, unspecified: Secondary | ICD-10-CM

## 2014-02-26 LAB — BASIC METABOLIC PANEL
ANION GAP: 8 (ref 5–15)
BUN: 30 mg/dL — ABNORMAL HIGH (ref 6–23)
CHLORIDE: 115 meq/L — AB (ref 96–112)
CO2: 27 meq/L (ref 19–32)
Calcium: 7.8 mg/dL — ABNORMAL LOW (ref 8.4–10.5)
Creatinine, Ser: 1.16 mg/dL (ref 0.50–1.35)
GFR calc Af Amer: 68 mL/min — ABNORMAL LOW (ref >90)
GFR calc non Af Amer: 58 mL/min — ABNORMAL LOW (ref >90)
GLUCOSE: 148 mg/dL — AB (ref 70–99)
Potassium: 3.8 mEq/L (ref 3.7–5.3)
Sodium: 150 mEq/L — ABNORMAL HIGH (ref 137–147)

## 2014-02-26 MED ORDER — LORAZEPAM 0.5 MG PO TABS: 0.5000 mg | ORAL_TABLET | Freq: Four times a day (QID) | ORAL | Status: AC | PRN

## 2014-02-26 MED ORDER — MORPHINE SULFATE 2 MG/ML IJ SOLN: 0.5000 mg | Freq: Once | INTRAMUSCULAR | Status: DC | PRN

## 2014-02-26 MED ORDER — BISACODYL 10 MG RE SUPP: 10.0000 mg | Freq: Every day | RECTAL | Status: DC | PRN

## 2014-02-26 MED ORDER — MORPHINE SULFATE (CONCENTRATE) 10 MG /0.5 ML PO SOLN: 5.0000 mg | ORAL | Status: DC | PRN

## 2014-02-26 MED ORDER — MORPHINE SULFATE (CONCENTRATE) 10 MG /0.5 ML PO SOLN: 5.0000 mg | ORAL | Status: AC | PRN

## 2014-02-26 NOTE — Progress Notes (Signed)
Contacted Dr. Claiborne Billings, MD on call for patient with concerns about his status and his decreased urine output.  Bolus was given to patient 250 mL.  Gave a description of pt status to doctor.  Talked with Chales Abrahams from [facility] about status of patient before coming here.  He has Lewy Bodies Dementia and Parkinson Disease.  His mobility and speech has declined.   Daughter called tonight.  She has concerns about pts status.

## 2014-02-26 NOTE — Progress Notes (Signed)
     Subjective: 2 Days Post-Op Procedure(s) (LRB): ARTHROPLASTY BIPOLAR HIP (Right)   Seen by Dr. Charlann Boxer. Patient appears to be comfortable, pain appears to be controlled.   Objective:   VITALS:   Filed Vitals:   02/26/14 0534  BP: 110/53  Pulse: 68  Temp: 98.7 F (37.1 C)  Resp: 14    Incision: dressing C/Stokes/I No cellulitis present Compartment soft  LABS  Recent Labs  02/24/14 0530  HGB 13.0  HCT 39.8  WBC 5.5  PLT 101*     Recent Labs  02/24/14 0530 02/25/14 0324 02/26/14 0430  NA 153* 151* 150*  K 4.0 4.1 3.8  BUN 29* 29* 30*  CREATININE 1.55* 1.28 1.16  GLUCOSE 86 106* 148*     Assessment/Plan: 2 Days Post-Op Procedure(s) (LRB): ARTHROPLASTY BIPOLAR HIP (Right) Up with therapy Discharge to SNF when ready  Ortho recommendations:  Lovenox for anticoagulation, unless other medically indicated (Rx written).  Ultracet for pain management (Rx written).  MiraLax and Colace for constipation  Iron 325 mg tid for 2-3 weeks   WBAT on the right leg.  Dressing to remain in place until follow in clinic in 2 weeks.  Dressing is waterproof and may shower with it in place.  Follow up in 2 weeks at [facility]. Follow up with OLIN,Reginald Stokes in 2 weeks.  Contact information:  [facility] [address], [address] [place] [address] [phone]       Anastasio Auerbach. Reginald Stokes   PAC  02/26/2014, 8:00 AM

## 2014-02-26 NOTE — Discharge Instructions (Signed)
WBAT R LE for transfers and activity  Keep incision dry until staples out  Staples out of right hip in 2 weeks (9/14)   Continue hospice care

## 2014-02-26 NOTE — Progress Notes (Signed)
Speech Language Pathology Treatment: Dysphagia  Patient Details Name: Reginald Stokes MRN: [id number] DOB: [birth year] Today's Date: 02/26/2014 Time: 1005-1020 SLP Time Calculation (min): 15 min  Assessment / Plan / Recommendation Clinical Impression  Skilled treatment session focused on possible readiness for PO intake. Upon arrival, pt was lethargic and required Max verbal and tactile cues for arousal. Patient verbalized short phrases to this clinician, "I'm fine, go ahead, etc." but was unable to open his eyes or follow any commands throughout the session. This clinician repositioned the patient and provided oral care via suction toothettes but patient was unable to open his oral cavity for thorough oral care or to receive ice chip trials despite total A multimodal cues. RN made aware and reports patient has been extremely lethargic since yesterday evening. Recommend patient remain NPO at this time due to high aspiration risk due to cognitive impairments and poor awareness of boluses. SLP will f/u at bedside.    HPI HPI: 80 year old with a history of HTN, CKD, advanced dementia, Parkinson admitted after falling at [facility] nursing home. Patient was brought to the emergency department and imaging was conducted showing a right subcapital femoral neck fracture. S/p right hip hemiarthroplasty 9/2. Note documentation of a cough with some apneic episodes post op. CXR on 9/3 showed no   Pertinent Vitals Pain Assessment:  (grimaces and yells out during repostioning)  SLP Plan  Continue with current plan of care    Recommendations Diet recommendations: NPO Medication Administration: Via alternative means          Oral Care Recommendations: Oral care Q4 per protocol Follow up Recommendations: Skilled Nursing facility Plan: Continue with current plan of care    GO     Derrian Rodak 02/26/2014, 10:30 AM  Feliberto Gottron, MA, CCC-SLP [phone]

## 2014-02-26 NOTE — Progress Notes (Signed)
[facility] PROGRESS NOTE  Reginald Stokes:[id number] DOB: 10-04-1933 DOA: 02/22/2014 PCP: Thane Edu, MD  Assessment/Plan: 80 year old with a history of HTN, CKD, CHF, Advanced Dementia, Parkinson percent after falling at [facility] nursing home. Patient was brought to the emergency department and imaging was conducted showing a right subcapital femoral neck fracture.   1. Right femoral neck fracture  -9/2: s/p Right hip hemiarthroplasty; per ortho   2. Coronary artery disease status post CABG in 2002  -EKG shows left ventricular hypertrophy with repolarization abnormalities  3. Parkinson's disease; on Sinemet  4. Advanced dementia; on Aricept as well as lorazepam as needed  5. Hypertension;on lisinopril  6. Thrombocytopenia Appears to be chronically low, we'll continue to monitor CBC  7. Hypernatremia likely due to free water deficit; Continue IVF, recheck  8. Severe malnutrition in the context of advanced dementia; cont supportive care  9. Cough, Some apneic episodes post op-resolved; CXR: no clear infiltrate; cont incentive spiro    Prognosis remains very poor due to advanced dementia, Pt does not communicate, ambulate well at baseline; poor nutritional status, not eating well, not participating well in rehab due to progressive dementia; remains at risk for recurrent infections, fall;  Pt is DNR, called d/w patient DPOA who is interested in hospice; will ask palliative/hospice care evaluation    Code Status: DNR Family Communication: d/w patient, called Icenhour,[name] [phone]; ortho plan d/w with family  -called updated [phone]    (indicate person spoken with, relationship, and if by phone, the number) Disposition Plan: pend clinical improvement    Consultants:  Ortho   Procedures:  none  Antibiotics:  none (indicate start date, and stop date if known)  HPI/Subjective: confused  Objective: Filed Vitals:   02/26/14 0534  BP: 110/53   Pulse: 68  Temp: 98.7 F (37.1 C)  Resp: 14    Intake/Output Summary (Last 24 hours) at 02/26/14 0942 Last data filed at 02/25/14 1800  Gross per 24 hour  Intake    700 ml  Output    275 ml  Net    425 ml   There were no vitals filed for this visit.  Exam:   General:  confused  Cardiovascular: s1,s2 rrr  Respiratory: CTA BL  Abdomen: soft, nt, nd   Musculoskeletal: no pedal edema   Data Reviewed: Basic Metabolic Panel:  Recent Labs Lab 02/22/14 1250 02/23/14 0517 02/24/14 0530 02/25/14 0324 02/26/14 0430  NA 151* 149* 153* 151* 150*  K 4.5 3.9 4.0 4.1 3.8  CL 112 112 116* 113* 115*  CO2 GLUCOSE 110* 92 86 106* 148*  BUN 25* 24* 29* 29* 30*  CREATININE 1.32 1.34 1.55* 1.28 1.16  CALCIUM 8.9 8.7 8.4 8.2* 7.8*   Liver Function Tests:  Recent Labs Lab 02/22/14 1250  AST 22  ALT 7  ALKPHOS 85  BILITOT 0.9  PROT 6.6  ALBUMIN 3.5   No results found for this basename: LIPASE, AMYLASE,  in the last 168 hours No results found for this basename: AMMONIA,  in the last 168 hours CBC:  Recent Labs Lab 02/22/14 1250 02/23/14 0517 02/24/14 0530  WBC 4.9 4.5 5.5  HGB 13.8 12.5* 13.0  HCT 42.6 39.2 39.8  MCV 83.4 86.0 84.5  PLT 129* 116* 101*   Cardiac Enzymes: No results found for this basename: CKTOTAL, CKMB, CKMBINDEX, TROPONINI,  in the last 168 hours BNP (last 3 results) No results found for this basename: PROBNP,  in the last  8760 hours CBG: No results found for this basename: GLUCAP,  in the last 168 hours  Recent Results (from the past 240 hour(s))  MRSA PCR SCREENING     Status: None   Collection Time    02/22/14  8:43 PM      Result Value Ref Range Status   MRSA by PCR NEGATIVE  NEGATIVE Final   Comment:            The GeneXpert MRSA Assay (FDA     approved for NASAL specimens     only), is one component of a     comprehensive MRSA colonization     surveillance program. It is not     intended to diagnose MRSA      infection nor to guide or     monitor treatment for     MRSA infections.     Studies: Dg Chest 1 View  02/25/2014   CLINICAL DATA:  Cough, shortness of Breath  EXAM: CHEST - 1 VIEW  COMPARISON:  05/01/2010  FINDINGS: Cardiomegaly. Status post CABG. Mild thoracic dextroscoliosis. No segmental infiltrate or pulmonary edema. Mild left basilar atelectasis.  IMPRESSION: Cardiomegaly. Mild thoracic dextroscoliosis. Status post CABG. Left basilar atelectasis.   Electronically Signed   By: Natasha Mead M.D.   On: 02/25/2014 09:23   Dg Pelvis Portable  02/24/2014   CLINICAL DATA:  Right hip replacement.  EXAM: PORTABLE PELVIS 1-2 VIEWS  COMPARISON:  02/22/2014.  FINDINGS: Patient status post right hip replacement with good anatomic alignment on AP view. No acute bony abnormality. Peripheral vascular disease.  IMPRESSION: 1.  Right hip replacement with good anatomic alignment on AP view .  2. Peripheral vascular disease.   Electronically Signed   By: Maisie Fus  Register   On: 02/24/2014 20:51    Scheduled Meds: . allopurinol  200 mg Oral Daily  . aspirin  81 mg Oral Daily  . carbidopa-levodopa  2 tablet Oral TID  . colchicine  0.6 mg Oral Daily  . donepezil  10 mg Oral QHS  . enoxaparin (LOVENOX) injection  30 mg Subcutaneous Q24H  . lisinopril  20 mg Oral Daily  . multivitamin  1 tablet Oral Daily  . pneumococcal 23 valent vaccine  0.5 mL Intramuscular Tomorrow-1000  . polyethylene glycol  17 g Oral Daily  . senna-docusate  1 tablet Oral BID   Continuous Infusions: . dextrose 5 % and 0.45% NaCl 100 mL/hr at 02/26/14 1610    Principal Problem:   Femoral neck fracture Active Problems:   Parkinson's disease   S/P CABG (coronary artery bypass graft)   Dementia   Hip fracture   Protein-calorie malnutrition, severe    Time spent: >35 minutes     Esperanza Sheets  [facility] Pager [phone]. If 7PM-7AM, please contact night-coverage at [email or web address], password [facility] 02/26/2014, 9:42 AM   LOS: 4 days

## 2014-02-26 NOTE — Clinical Social Work Note (Signed)
Patient transferred to unit 5N- report given to CSW covering that unit- plans in place for d/c back to [place] at d/c-  Reece Levy, MSW, [employer] [phone]

## 2014-02-26 NOTE — Consult Note (Signed)
Patient ON:Reginald Stokes      DOB: August 28, 1933      MWU:[id number]     Consult Note from the Palliative Medicine Team at [facility]    Consult Requested by: Dr. York Spaniel     PCP: Thane Edu, MD Reason for Consultation: GOC and options    Phone Number:[phone]  Assessment of patients Current state: Reginald Stokes is very lethargic but will respond to my voice with repeated attempts. He does not open his eyes and he reaches to remove his nasal cannula. I discussed with Reginald Stokes family HCPOAs over phone - daughter Reginald Stokes [phone]), and son Reginald Stokes [phone]). We discussed Reginald Stokes advanced dementia and now his dysphagia status - overall prognosis is very poor. Family is saddened but understanding and have been trying to prepare themselves for this time. They have had recent conversations regarding his poor prognosis with his dementia. They understand that we are very limited with what we can provide medically to reverse his condition. They decide that it is time to focus on his comfort and when discussing where this care should take place they feel like Reginald Stokes is now his home with hospice assistance. They are planning a time where they will be able to speak with Reginald Stokes wife Reginald Stokes Jr's mother) and they believe she will best be able to be with him at [place].    Goals of Care: 1.  Code Status: DNR   2. Scope of Treatment: Proceed with comfort measures.    4. Disposition: SNF with hospice.    3. Symptom Management:   1. Anxiety/Agitation: Lorazepam 0.5 mg every 6 hours prn.  2. Pain/dyspnea: Roxanol 5 mg every 2 hours prn.  3. Bowel Regimen: Dulcolax supp prn.  4. Dysphagia: Recommend comfort feeds if awake and alert.   4. Psychosocial: Emotional support provided to patient and family via telephone.    Brief HPI: 80 yo admitted with right femoral neck fracture r/t falls. Advanced dementia with multiple falls with unsteady gait, confusion with  minimal speech but an occasional mumble, wheelchair bound, decreased intake, weight loss/muscle wasting. PMH significant for Parkinson's, hyperthyroidism, depression, gout, CAD, CHF.    ROS: Unable to assess - confused.     PMH:  Past Medical History  Diagnosis Date  . Parkinson disease   . Dementia   . Hyperthyroidism   . Depression   . Gout   . CAD (coronary artery disease)   . CHF (congestive heart failure)   . Stable angina      PSH: Past Surgical History  Procedure Laterality Date  . Coronary artery bypass graft    . Hip arthroplasty Right 02/24/2014    Procedure: ARTHROPLASTY BIPOLAR HIP;  Surgeon: Shelda Pal, MD;  Location: [facility] OR;  Service: Orthopedics;  Laterality: Right;   I have reviewed the FH and SH and  If appropriate update it with new information. No Known Allergies Scheduled Meds: . allopurinol  200 mg Oral Daily  . aspirin  81 mg Oral Daily  . carbidopa-levodopa  2 tablet Oral TID  . colchicine  0.6 mg Oral Daily  . donepezil  10 mg Oral QHS  . enoxaparin (LOVENOX) injection  30 mg Subcutaneous Q24H  . lisinopril  20 mg Oral Daily  . multivitamin  1 tablet Oral Daily  . pneumococcal 23 valent vaccine  0.5 mL Intramuscular Tomorrow-1000  . polyethylene glycol  17 g Oral Daily  . senna-docusate  1 tablet Oral BID   Continuous Infusions: .  dextrose 5 % and 0.45% NaCl 100 mL/hr at 02/26/14 0648   PRN Meds:.acetaminophen, acetaminophen, HYDROcodone-acetaminophen, ipratropium-albuterol, LORazepam, menthol-cetylpyridinium, metoCLOPramide (REGLAN) injection, metoCLOPramide, morphine injection, ondansetron (ZOFRAN) IV, ondansetron, phenol, polyethylene glycol    BP 110/53  Pulse 68  Temp(Src) 98.7 F (37.1 C) (Oral)  Resp 14  SpO2 98%   PPS: 20% at best   Intake/Output Summary (Last 24 hours) at 02/26/14 1159 Last data filed at 02/25/14 1800  Gross per 24 hour  Intake    600 ml  Output    275 ml  Net    325 ml    Physical Exam:  General:  NAD, thin, frail, confused HEENT: [place]/AT, no JVD, moist mucous membranes Chest: CTA throughout, no labored breathing, symmetric CVS: RRR, S1 S2 Abdomen: Soft, NT, ND, +BS Ext: No edema, warm to touch, no cyanosis Neuro: Arousable, confused  Labs: CBC    Component Value Date/Time   WBC 5.5 02/24/2014 0530   RBC 4.71 02/24/2014 0530   HGB 13.0 02/24/2014 0530   HCT 39.8 02/24/2014 0530   PLT 101* 02/24/2014 0530   MCV 84.5 02/24/2014 0530   MCH 27.6 02/24/2014 0530   MCHC 32.7 02/24/2014 0530   RDW 15.3 02/24/2014 0530   LYMPHSABS 1.3 12/20/2009 0735   MONOABS 0.5 12/20/2009 0735   EOSABS 0.1 12/20/2009 0735   BASOSABS 0.0 12/20/2009 0735    BMET    Component Value Date/Time   NA 150* 02/26/2014 0430   K 3.8 02/26/2014 0430   CL 115* 02/26/2014 0430   CO2 27 02/26/2014 0430   GLUCOSE 148* 02/26/2014 0430   BUN 30* 02/26/2014 0430   CREATININE 1.16 02/26/2014 0430   CALCIUM 7.8* 02/26/2014 0430   GFRNONAA 58* 02/26/2014 0430   GFRAA 68* 02/26/2014 0430    CMP     Component Value Date/Time   NA 150* 02/26/2014 0430   K 3.8 02/26/2014 0430   CL 115* 02/26/2014 0430   CO2 27 02/26/2014 0430   GLUCOSE 148* 02/26/2014 0430   BUN 30* 02/26/2014 0430   CREATININE 1.16 02/26/2014 0430   CALCIUM 7.8* 02/26/2014 0430   PROT 6.6 02/22/2014 1250   ALBUMIN 3.5 02/22/2014 1250   AST 22 02/22/2014 1250   ALT 7 02/22/2014 1250   ALKPHOS 85 02/22/2014 1250   BILITOT 0.9 02/22/2014 1250   GFRNONAA 58* 02/26/2014 0430   GFRAA 68* 02/26/2014 0430     Time In Time Out Total Time Spent with Patient Total Overall Time  1045 1210     Greater than 50%  of this time was spent counseling and coordinating care related to the above assessment and plan.  Yong Channel, NP Palliative Medicine Team Pager # [phone] (M-F 8a-5p) Team Phone # [phone] (Nights/Weekends)

## 2014-02-26 NOTE — Progress Notes (Signed)
Patient for discharge back to SNF bed at [facility]  today. Plans confirmed with patient and family who are in agreement. SNF is prepared for patient and we will  plan transfer via  [facility].   Derrell Lolling, MSW Clinical Social Worker [phone]

## 2014-02-26 NOTE — Discharge Summary (Signed)
Physician Discharge Summary  Reginald Stokes:[id number] DOB: 02/04/1934 DOA: 02/22/2014  PCP: Thane Edu, MD  Admit date: 02/22/2014 Discharge date: 02/26/2014  Time spent: >35 minutes  Recommendations for Outpatient Follow-up:  Hospice care    Discharge Diagnoses:  Principal Problem:   Femoral neck fracture Active Problems:   Parkinson's disease   S/P CABG (coronary artery bypass graft)   Dementia   Hip fracture   Protein-calorie malnutrition, severe   Discharge Condition: stable   Diet recommendation: comfort   There were no vitals filed for this visit.  History of present illness:  80 year old with a history of HTN, CKD, CHF, Advanced Dementia, Parkinson percent after falling at [facility] nursing home. Patient was brought to the emergency department and imaging was conducted showing a right subcapital femoral neck fracture.    Hospital Course:  1. Right femoral neck fracture  -9/2: s/p Right hip hemiarthroplasty;  2. Coronary artery disease status post CABG in 2002  -EKG shows left ventricular hypertrophy with repolarization abnormalities  3. Parkinson's disease; Advanced dementia; Hypertension;Thrombocytopenia Hypernatremia, Severe malnutrition    Prognosis remains very poor due to advanced dementia, Pt does not communicate, ambulate wel; poor nutritional status, not eating, drinking well, not participating well in rehab due to progressive dementia; remains at risk for recurrent infections, fall; poor quality of life -Pt is DNR, called d/w patient DPOA who is interested in hospice;appriciate palliative care involvement; Patient is being d/c under hospice care; medications adjusted for comfort care     Procedures:  9/2: s/p Right hip hemiarthroplasty; (i.e. Studies not automatically included, echos, thoracentesis, etc; not x-rays)  Consultations:  Ortho, palliative care   Discharge Exam: Filed Vitals:   02/26/14 0534  BP: 110/53  Pulse: 68   Temp: 98.7 F (37.1 C)  Resp: 14    General: confused Cardiovascular: s1,s2 rrr Respiratory: CTA BL:  Discharge Instructions  Discharge Instructions   Diet - low sodium heart healthy    Complete by:  As directed      Discharge instructions    Complete by:  As directed   Please follow up with hospice care     Increase activity slowly    Complete by:  As directed      Weight bearing as tolerated    Complete by:  As directed             Medication List    STOP taking these medications       allopurinol 100 MG tablet  Commonly known as:  ZYLOPRIM     aspirin 81 MG tablet     cholecalciferol 1000 UNITS tablet  Commonly known as:  VITAMIN D     COLCRYS 0.6 MG tablet  Generic drug:  colchicine     donepezil 10 MG tablet  Commonly known as:  ARICEPT     furosemide 40 MG tablet  Commonly known as:  LASIX     lisinopril 20 MG tablet  Commonly known as:  PRINIVIL,ZESTRIL     multivitamin tablet     nitroGLYCERIN 0.4 MG SL tablet  Commonly known as:  NITROSTAT     potassium chloride 20 MEQ packet  Commonly known as:  KLOR-CON     traMADol 50 MG tablet  Commonly known as:  ULTRAM      TAKE these medications       bisacodyl 10 MG suppository  Commonly known as:  DULCOLAX  Place 10 mg rectally as needed for moderate constipation.     carbidopa-levodopa  50-200 MG per tablet  Commonly known as:  SINEMET CR  Take 2 tablets by mouth 3 (three) times daily.     enoxaparin 40 MG/0.4ML injection  Commonly known as:  LOVENOX  Inject 0.4 mLs (40 mg total) into the skin daily.     feeding supplement (PRO-STAT SUGAR FREE 64) Liqd  Take 30 mLs by mouth daily.     feeding supplement (PRO-STAT 64) Liqd  Take 30 mLs by mouth daily.     ipratropium-albuterol 0.5-2.5 (3) MG/3ML Soln  Commonly known as:  DUONEB  Take 3 mLs by nebulization daily as needed (for wheezing).     LORazepam 0.5 MG tablet  Commonly known as:  ATIVAN  Take 1 tablet (0.5 mg total) by  mouth every 6 (six) hours as needed (for agitation).     magnesium hydroxide 800 MG/5ML suspension  Commonly known as:  MILK OF MAGNESIA  Take 30 mLs by mouth daily as needed for constipation.     morphine CONCENTRATE 10 mg / 0.5 ml concentrated solution  Take 0.25 mLs (5 mg total) by mouth every 2 (two) hours as needed for moderate pain or shortness of breath.     RA SALINE ENEMA RE  Place 1 each rectally as needed (for constipation).     SYSTANE ULTRA PF 0.4-0.3 % Soln  Generic drug:  Polyethyl Glycol-Propyl Glycol  Place 1 drop into both eyes 3 (three) times daily.     traMADol-acetaminophen 37.5-325 MG per tablet  Commonly known as:  ULTRACET  Take 1 tablet by mouth every 6 (six) hours as needed.       No Known Allergies     Follow-up Information   Follow up with Shelda Pal, MD In 2 weeks. (For staple removal)    Specialty:  Orthopedic Surgery   Contact information:   [address] [place] [address] [phone]       Follow up with Thane Edu, MD In 1 week.   Specialty:  Family Medicine   Contact information:   [address] [place] [address] [phone]        The results of significant diagnostics from this hospitalization (including imaging, microbiology, ancillary and laboratory) are listed below for reference.    Significant Diagnostic Studies: Dg Chest 1 View  02/25/2014   CLINICAL DATA:  Cough, shortness of Breath  EXAM: CHEST - 1 VIEW  COMPARISON:  05/01/2010  FINDINGS: Cardiomegaly. Status post CABG. Mild thoracic dextroscoliosis. No segmental infiltrate or pulmonary edema. Mild left basilar atelectasis.  IMPRESSION: Cardiomegaly. Mild thoracic dextroscoliosis. Status post CABG. Left basilar atelectasis.   Electronically Signed   By: Natasha Mead M.D.   On: 02/25/2014 09:23   Dg Pelvis 1-2 Views  02/22/2014   CLINICAL DATA:  Fall.  Right leg deformity.  EXAM: PELVIS - 1-2 VIEW  COMPARISON:  None.  FINDINGS:  Frontal view of the pelvis shows diffuse bony demineralization. SI joints and symphysis pubis are within normal limits. Left femoral neck is intact. There is a fracture of the right femoral neck with a degree of impaction. Fracture line is at the subcapital position.  IMPRESSION: Subcapital right femoral neck fracture.   Electronically Signed   By: Kennith Center M.D.   On: 02/22/2014 11:42   Dg Femur Right  02/22/2014   CLINICAL DATA:  Fall with right leg deformity.  EXAM: RIGHT FEMUR - 2 VIEW  COMPARISON:  None.  FINDINGS: Two view exam of the right femur shows diffuse  bony demineralization. Subcapital right femoral neck fracture is evident. No other fracture within the right femur is visible.  IMPRESSION: Subcapital right femoral neck fracture.   Electronically Signed   By: Kennith Center M.D.   On: 02/22/2014 11:43   Dg Pelvis Portable  02/24/2014   CLINICAL DATA:  Right hip replacement.  EXAM: PORTABLE PELVIS 1-2 VIEWS  COMPARISON:  02/22/2014.  FINDINGS: Patient status post right hip replacement with good anatomic alignment on AP view. No acute bony abnormality. Peripheral vascular disease.  IMPRESSION: 1.  Right hip replacement with good anatomic alignment on AP view .  2. Peripheral vascular disease.   Electronically Signed   By: Maisie Fus  Register   On: 02/24/2014 20:51    Microbiology: Recent Results (from the past 240 hour(s))  MRSA PCR SCREENING     Status: None   Collection Time    02/22/14  8:43 PM      Result Value Ref Range Status   MRSA by PCR NEGATIVE  NEGATIVE Final   Comment:            The GeneXpert MRSA Assay (FDA     approved for NASAL specimens     only), is one component of a     comprehensive MRSA colonization     surveillance program. It is not     intended to diagnose MRSA     infection nor to guide or     monitor treatment for     MRSA infections.     Labs: Basic Metabolic Panel:  Recent Labs Lab 02/22/14 1250 02/23/14 0517 02/24/14 0530 02/25/14 0324  02/26/14 0430  NA 151* 149* 153* 151* 150*  K 4.5 3.9 4.0 4.1 3.8  CL 112 112 116* 113* 115*  CO2 GLUCOSE 110* 92 86 106* 148*  BUN 25* 24* 29* 29* 30*  CREATININE 1.32 1.34 1.55* 1.28 1.16  CALCIUM 8.9 8.7 8.4 8.2* 7.8*   Liver Function Tests:  Recent Labs Lab 02/22/14 1250  AST 22  ALT 7  ALKPHOS 85  BILITOT 0.9  PROT 6.6  ALBUMIN 3.5   No results found for this basename: LIPASE, AMYLASE,  in the last 168 hours No results found for this basename: AMMONIA,  in the last 168 hours CBC:  Recent Labs Lab 02/22/14 1250 02/23/14 0517 02/24/14 0530  WBC 4.9 4.5 5.5  HGB 13.8 12.5* 13.0  HCT 42.6 39.2 39.8  MCV 83.4 86.0 84.5  PLT 129* 116* 101*   Cardiac Enzymes: No results found for this basename: CKTOTAL, CKMB, CKMBINDEX, TROPONINI,  in the last 168 hours BNP: BNP (last 3 results) No results found for this basename: PROBNP,  in the last 8760 hours CBG: No results found for this basename: GLUCAP,  in the last 168 hours     Signed:  Esperanza Sheets  [facility] 02/26/2014, 12:26 PM

## 2014-03-02 NOTE — ED Provider Notes (Signed)
I saw and evaluated the patient, reviewed the resident's note and I agree with the findings and plan.   EKG Interpretation   Date/Time:  Monday February 22 2014 10:30:08 EDT Ventricular Rate:  85 PR Interval:  131 QRS Duration: 95 QT Interval:  399 QTC Calculation: 474 R Axis:   15 Text Interpretation:  Sinus rhythm Ventricular premature complex  Non-specific ST-t changes Left ventricular hypertrophy with repolarization  abnormality Confirmed by Denton Lank  MD, Caryn Bee ([id number]) on 02/22/2014 10:50:44  AM      Pt s/p fall at ecf. C/o right hip pain. Xrays. Hip fx, admit. Spine nt.   Suzi Roots, MD 03/02/14 [phone]

## 2014-03-25 DEATH — deceased

## 2014-03-27 NOTE — Consult Note (Signed)
I have reviewed and discussed case with Nurse Practitioner And agree with documentation and plan as noted above   Dawnyel Leven J. Ellyson Rarick D.O.  Palliative Medicine Team at [place]  Team Phone: [phone]    

## 2015-05-05 IMAGING — CR DG CHEST 1V
1 series · 1 of 1 positions shown · non-contrast
Comparison: 05/01/2010

CLINICAL DATA: Cough, shortness of Breath

EXAM:
CHEST - 1 VIEW

[AP]
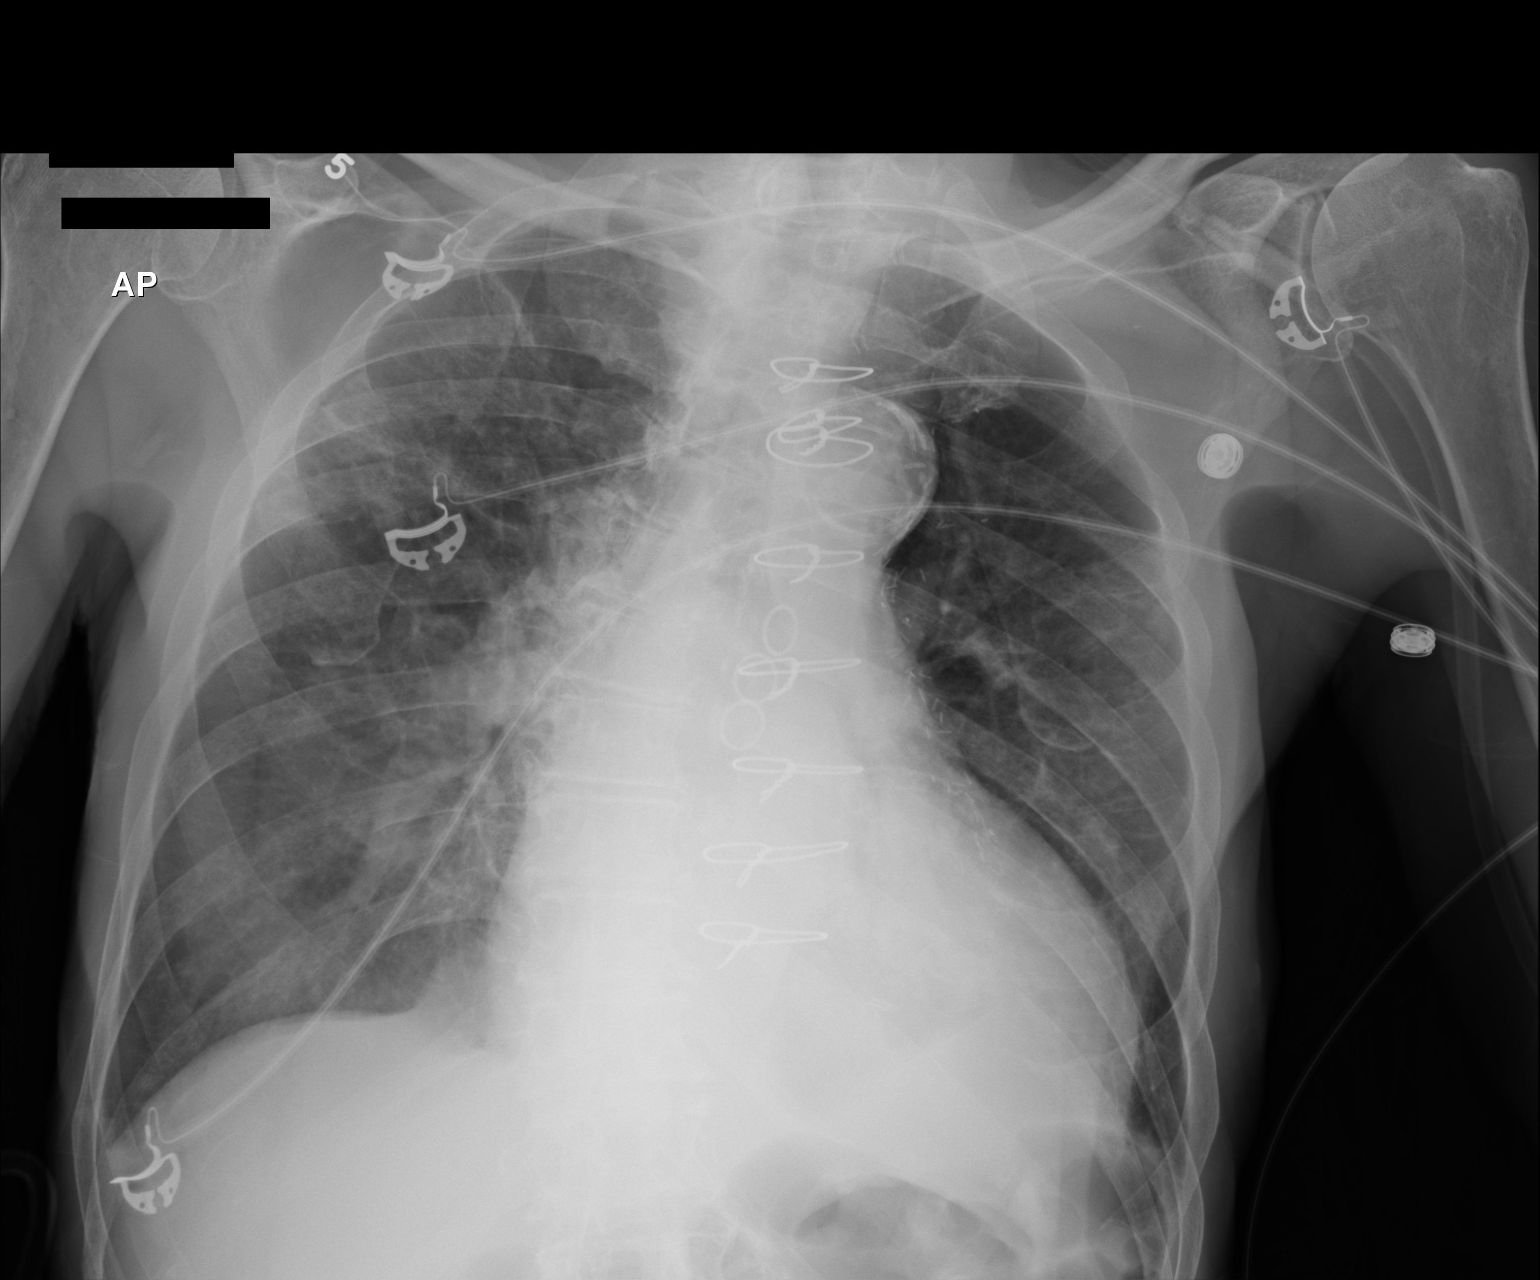

[1 of 1 positions shown; findings below may reference images not displayed]

FINDINGS: Cardiomegaly. Status post CABG. Mild thoracic dextroscoliosis. No
segmental infiltrate or pulmonary edema. Mild left basilar
atelectasis.
IMPRESSION: Cardiomegaly. Mild thoracic dextroscoliosis. Status post CABG. Left
basilar atelectasis.
